# Patient Record
Sex: Female | Born: 1980 | Race: Black or African American | Hispanic: No | Marital: Single | Smoking: Former smoker
Health system: Southern US, Community
[De-identification: ages and names within clinical notes are randomized; demographics above are authoritative.]

## PROBLEM LIST (undated history)

## (undated) DIAGNOSIS — I890 Lymphedema, not elsewhere classified: Secondary | ICD-10-CM

## (undated) DIAGNOSIS — M419 Scoliosis, unspecified: Secondary | ICD-10-CM

## (undated) HISTORY — PX: OOPHORECTOMY: SHX86

---

## 2013-10-09 ENCOUNTER — Emergency Department: Payer: Self-pay | Admitting: Emergency Medicine

## 2013-12-25 ENCOUNTER — Emergency Department: Payer: Self-pay | Admitting: Emergency Medicine

## 2013-12-25 LAB — URINALYSIS, COMPLETE
Bacteria: NONE SEEN
Bilirubin,UR: NEGATIVE
GLUCOSE, UR: NEGATIVE mg/dL (ref 0–75)
Ketone: NEGATIVE
NITRITE: NEGATIVE
PH: 6 (ref 4.5–8.0)
Protein: NEGATIVE
RBC,UR: 4 /HPF (ref 0–5)
Specific Gravity: 1.015 (ref 1.003–1.030)

## 2015-04-04 ENCOUNTER — Encounter: Payer: Self-pay | Admitting: Emergency Medicine

## 2015-04-04 ENCOUNTER — Emergency Department
Admission: EM | Admit: 2015-04-04 | Discharge: 2015-04-04 | Disposition: A | Discharge status: Home / Self Care | Payer: Medicaid Other | Attending: Emergency Medicine | Admitting: Emergency Medicine

## 2015-04-04 DIAGNOSIS — Z720 Tobacco use: Secondary | ICD-10-CM | POA: Insufficient documentation

## 2015-04-04 DIAGNOSIS — K029 Dental caries, unspecified: Secondary | ICD-10-CM | POA: Insufficient documentation

## 2015-04-04 HISTORY — DX: Lymphedema, not elsewhere classified: I89.0

## 2015-04-04 HISTORY — DX: Scoliosis, unspecified: M41.9

## 2015-04-04 MED ORDER — AMOXICILLIN 500 MG PO CAPS: 500.0000 mg | ORAL_CAPSULE | Freq: Three times a day (TID) | ORAL | Status: DC

## 2015-04-04 MED ORDER — LIDOCAINE VISCOUS 2 % MT SOLN
15.0000 mL | Freq: Once | OROMUCOSAL | Status: AC
  Administered 2015-04-04: 15 mL via OROMUCOSAL
  Filled 2015-04-04: qty 15

## 2015-04-04 MED ORDER — OXYCODONE-ACETAMINOPHEN 5-325 MG PO TABS
1.0000 | ORAL_TABLET | Freq: Once | ORAL | Status: AC
  Administered 2015-04-04: 1 via ORAL
  Filled 2015-04-04: qty 1

## 2015-04-04 MED ORDER — KETOROLAC TROMETHAMINE 10 MG PO TABS
10.0000 mg | ORAL_TABLET | Freq: Once | ORAL | Status: AC
  Administered 2015-04-04: 10 mg via ORAL
  Filled 2015-04-04: qty 1

## 2015-04-04 MED ORDER — KETOROLAC TROMETHAMINE 10 MG PO TABS: 10.0000 mg | ORAL_TABLET | Freq: Three times a day (TID) | ORAL | Status: DC | PRN

## 2015-04-04 MED ORDER — AMOXICILLIN 500 MG PO CAPS
500.0000 mg | ORAL_CAPSULE | Freq: Once | ORAL | Status: AC
  Administered 2015-04-04: 500 mg via ORAL
  Filled 2015-04-04: qty 1

## 2015-04-04 NOTE — Discharge Instructions (Signed)
Dental Caries °Dental caries (also called tooth decay) is the most common oral disease. It can occur at any age but is more common in children and young adults.  °HOW DENTAL CARIES DEVELOPS  °The process of decay begins when bacteria and foods (particularly sugars and starches) combine in your mouth to produce plaque. Plaque is a substance that sticks to the hard, outer surface of a tooth (enamel). The bacteria in plaque produce acids that attack enamel. These acids may also attack the root surface of a tooth (cementum) if it is exposed. Repeated attacks dissolve these surfaces and create holes in the tooth (cavities). If left untreated, the acids destroy the other layers of the tooth.  °RISK FACTORS °· Frequent sipping of sugary beverages.   °· Frequent snacking on sugary and starchy foods, especially those that easily get stuck in the teeth.   °· Poor oral hygiene.   °· Dry mouth.   °· Substance abuse such as methamphetamine abuse.   °· Broken or poor-fitting dental restorations.   °· Eating disorders.   °· Gastroesophageal reflux disease (GERD).   °· Certain radiation treatments to the head and neck. °SYMPTOMS °In the early stages of dental caries, symptoms are seldom present. Sometimes white, chalky areas may be seen on the enamel or other tooth layers. In later stages, symptoms may include: °· Pits and holes on the enamel. °· Toothache after sweet, hot, or cold foods or drinks are consumed. °· Pain around the tooth. °· Swelling around the tooth. °DIAGNOSIS  °Most of the time, dental caries is detected during a regular dental checkup. A diagnosis is made after a thorough medical and dental history is taken and the surfaces of your teeth are checked for signs of dental caries. Sometimes special instruments, such as lasers, are used to check for dental caries. Dental X-ray exams may be taken so that areas not visible to the eye (such as between the contact areas of the teeth) can be checked for cavities.    °TREATMENT  °If dental caries is in its early stages, it may be reversed with a fluoride treatment or an application of a remineralizing agent at the dental office. Thorough brushing and flossing at home is needed to aid these treatments. If it is in its later stages, treatment depends on the location and extent of tooth destruction:  °· If a small area of the tooth has been destroyed, the destroyed area will be removed and cavities will be filled with a material such as gold, silver amalgam, or composite resin.   °· If a large area of the tooth has been destroyed, the destroyed area will be removed and a cap (crown) will be fitted over the remaining tooth structure.   °· If the center part of the tooth (pulp) is affected, a procedure called a root canal will be needed before a filling or crown can be placed.   °· If most of the tooth has been destroyed, the tooth may need to be pulled (extracted). °HOME CARE INSTRUCTIONS °You can prevent, stop, or reverse dental caries at home by practicing good oral hygiene. Good oral hygiene includes: °· Thoroughly cleaning your teeth at least twice a day with a toothbrush and dental floss.   °· Using a fluoride toothpaste. A fluoride mouth rinse may also be used if recommended by your dentist or health care provider.   °· Restricting the amount of sugary and starchy foods and sugary liquids you consume.   °· Avoiding frequent snacking on these foods and sipping of these liquids.   °· Keeping regular visits with   a dentist for checkups and cleanings. PREVENTION   Practice good oral hygiene.  Consider a dental sealant. A dental sealant is a coating material that is applied by your dentist to the pits and grooves of teeth. The sealant prevents food from being trapped in them. It may protect the teeth for several years.  Ask about fluoride supplements if you live in a community without fluorinated water or with water that has a low fluoride content. Use fluoride supplements  as directed by your dentist or health care provider.  Allow fluoride varnish applications to teeth if directed by your dentist or health care provider. Document Released: 05/27/2002 Document Revised: 01/19/2014 Document Reviewed: 09/06/2012 [facility] Patient Information 2015 [employer], Maryland[employer]. This information is not intended to replace advice given to you by your health care provider. Make sure you discuss any questions you have with your health care provider.  Dental Caries Dental caries (also called tooth decay) is the most common oral disease. It can occur at any age but is more common in children and young adults.  HOW DENTAL CARIES DEVELOPS  The process of decay begins when bacteria and foods (particularly sugars and starches) combine in your mouth to produce plaque. Plaque is a substance that sticks to the hard, outer surface of a tooth (enamel). The bacteria in plaque produce acids that attack enamel. These acids may also attack the root surface of a tooth (cementum) if it is exposed. Repeated attacks dissolve these surfaces and create holes in the tooth (cavities). If left untreated, the acids destroy the other layers of the tooth.  RISK FACTORS  Frequent sipping of sugary beverages.   Frequent snacking on sugary and starchy foods, especially those that easily get stuck in the teeth.   Poor oral hygiene.   Dry mouth.   Substance abuse such as methamphetamine abuse.   Broken or poor-fitting dental restorations.   Eating disorders.   Gastroesophageal reflux disease (GERD).   Certain radiation treatments to the head and neck. SYMPTOMS In the early stages of dental caries, symptoms are seldom present. Sometimes white, chalky areas may be seen on the enamel or other tooth layers. In later stages, symptoms may include:  Pits and holes on the enamel.  Toothache after sweet, hot, or cold foods or drinks are consumed.  Pain around the tooth.  Swelling around the  tooth. DIAGNOSIS  Most of the time, dental caries is detected during a regular dental checkup. A diagnosis is made after a thorough medical and dental history is taken and the surfaces of your teeth are checked for signs of dental caries. Sometimes special instruments, such as lasers, are used to check for dental caries. Dental X-ray exams may be taken so that areas not visible to the eye (such as between the contact areas of the teeth) can be checked for cavities.  TREATMENT  If dental caries is in its early stages, it may be reversed with a fluoride treatment or an application of a remineralizing agent at the dental office. Thorough brushing and flossing at home is needed to aid these treatments. If it is in its later stages, treatment depends on the location and extent of tooth destruction:   If a small area of the tooth has been destroyed, the destroyed area will be removed and cavities will be filled with a material such as gold, silver amalgam, or composite resin.   If a large area of the tooth has been destroyed, the destroyed area will be removed and a cap (  crown) will be fitted over the remaining tooth structure.   °· If the center part of the tooth (pulp) is affected, a procedure called a root canal will be needed before a filling or crown can be placed.   °· If most of the tooth has been destroyed, the tooth may need to be pulled (extracted). °HOME CARE INSTRUCTIONS °You can prevent, stop, or reverse dental caries at home by practicing good oral hygiene. Good oral hygiene includes: °· Thoroughly cleaning your teeth at least twice a day with a toothbrush and dental floss.   °· Using a fluoride toothpaste. A fluoride mouth rinse may also be used if recommended by your dentist or health care provider.   °· Restricting the amount of sugary and starchy foods and sugary liquids you consume.   °· Avoiding frequent snacking on these foods and sipping of these liquids.   °· Keeping regular visits with a  dentist for checkups and cleanings. °PREVENTION  °· Practice good oral hygiene. °· Consider a dental sealant. A dental sealant is a coating material that is applied by your dentist to the pits and grooves of teeth. The sealant prevents food from being trapped in them. It may protect the teeth for several years. °· Ask about fluoride supplements if you live in a community without fluorinated water or with water that has a low fluoride content. Use fluoride supplements as directed by your dentist or health care provider. °· Allow fluoride varnish applications to teeth if directed by your dentist or health care provider. °Document Released: 05/27/2002 Document Revised: 01/19/2014 Document Reviewed: 09/06/2012 °ExitCare® Patient Information ©2015 [employer], LLC. This information is not intended to replace advice given to you by your health care provider. Make sure you discuss any questions you have with your health care provider. ° °

## 2015-04-04 NOTE — ED Provider Notes (Signed)
[facility] Emergency Department Provider Note  ____________________________________________  Time seen: 11:40 AM  I have reviewed the triage vital signs and the nursing notes.   HISTORY  Chief Complaint Dental Pain      HPI Stacy Downs is a 34 y.o. female presents with toothache 2 weeks but worse in the last 2 days.Patient denies any fever no difficulty swallowing.    Past Medical History  Diagnosis Date  . Lymphedema   . Scoliosis     There are no active problems to display for this patient.   Past Surgical History  Procedure Laterality Date  . Oophorectomy Left     No current outpatient prescriptions on file.  Allergies Shrimp  History reviewed. No pertinent family history.  Social History History  Substance Use Topics  . Smoking status: Current Every Day Smoker -- 0.50 packs/day  . Smokeless tobacco: Never Used  . Alcohol Use: Yes     Comment: occasional    Review of Systems  Constitutional: Negative for fever. Eyes: Negative for visual changes. ENT: Negative for sore throat. Positive for toothache Cardiovascular: Negative for chest pain. Respiratory: Negative for shortness of breath. Gastrointestinal: Negative for abdominal pain, vomiting and diarrhea. Genitourinary: Negative for dysuria. Musculoskeletal: Negative for back pain. Skin: Negative for rash. Neurological: Negative for headaches, focal weakness or numbness.   10-point ROS otherwise negative.  ____________________________________________   PHYSICAL EXAM:  VITAL SIGNS: ED Triage Vitals  Enc Vitals Group     BP 04/04/15 1111 114/71 mmHg     Pulse Rate 04/04/15 1111 83     Resp 04/04/15 1111 16     Temp 04/04/15 1111 97.7 F (36.5 C)     Temp Source 04/04/15 1111 Oral     SpO2 04/04/15 1111 100 %     Weight --      Height --      Head Cir --      Peak Flow --      Pain Score 04/04/15 1112 10     Pain Loc --      Pain Edu? --      Excl. in  GC? --      Constitutional: Alert and oriented. Well appearing and in no distress. Eyes: Conjunctivae are normal. PERRL. Normal extraocular movements. ENT   Head: Normocephalic and atraumatic.   Nose: No congestion/rhinnorhea.   Mouth/Throat: Mucous membranes are moist. Multiple dental caries involving maxilla central incisor and right maxillary premolar.   Neck: No stridor. Hematological/Lymphatic/Immunilogical: No cervical lymphadenopathy. Cardiovascular: Normal rate, regular rhythm. Normal and symmetric distal pulses are present in all extremities. No murmurs, rubs, or gallops. Respiratory: Normal respiratory effort without tachypnea nor retractions. Breath sounds are clear and equal bilaterally. No wheezes/rales/rhonchi. Gastrointestinal: Soft and nontender. No distention. There is no CVA tenderness. Genitourinary: deferred Musculoskeletal: Nontender with normal range of motion in all extremities. No joint effusions.  No lower extremity tenderness nor edema. Neurologic:  Normal speech and language. No gross focal neurologic deficits are appreciated. Speech is normal.  Skin:  Skin is warm, dry and intact. No rash noted. Psychiatric: Mood and affect are normal. Speech and behavior are normal. Patient exhibits appropriate insight and judgment.  ____________________________________________     INITIAL IMPRESSION / ASSESSMENT AND PLAN / ED COURSE  Pertinent labs & imaging results that were available during my care of the patient were reviewed by me and considered in my medical decision making (see chart for details).  Strip physical exam consistent with multiple dental  caries as such patient received viscous lidocaine swish and spit one Percocet and amoxicillin.  ____________________________________________   FINAL CLINICAL IMPRESSION(S) / ED DIAGNOSES  Final diagnoses:  Dental caries      Darci Currentandolph N Mauria Asquith, MD 04/06/15 [phone]

## 2015-04-04 NOTE — ED Notes (Signed)
Pt complains of a toothache on left side of mouth for the last two days, pt denies any problems

## 2015-04-04 NOTE — ED Notes (Signed)
Pt states she has had toothache for 2 weeks. It went away but came back last night. She took tylenol and anbesol, with no relief. Pt states it is giving her a splitting headache and that she is unable to eat or sleep due to the pain.

## 2015-05-18 ENCOUNTER — Encounter: Payer: Self-pay | Admitting: Emergency Medicine

## 2015-05-18 ENCOUNTER — Emergency Department
Admission: EM | Admit: 2015-05-18 | Discharge: 2015-05-18 | Disposition: A | Discharge status: Home / Self Care | Payer: No Typology Code available for payment source | Attending: Student | Admitting: Student

## 2015-05-18 DIAGNOSIS — Z720 Tobacco use: Secondary | ICD-10-CM | POA: Diagnosis not present

## 2015-05-18 DIAGNOSIS — K088 Other specified disorders of teeth and supporting structures: Secondary | ICD-10-CM | POA: Diagnosis present

## 2015-05-18 DIAGNOSIS — K0889 Other specified disorders of teeth and supporting structures: Secondary | ICD-10-CM

## 2015-05-18 MED ORDER — LIDOCAINE VISCOUS 2 % MT SOLN: 20.0000 mL | OROMUCOSAL | Status: DC | PRN

## 2015-05-18 MED ORDER — HYDROCODONE-ACETAMINOPHEN 5-325 MG PO TABS: 1.0000 | ORAL_TABLET | ORAL | Status: DC | PRN

## 2015-05-18 MED ORDER — NAPROXEN 500 MG PO TBEC: 500.0000 mg | DELAYED_RELEASE_TABLET | Freq: Two times a day (BID) | ORAL | Status: DC

## 2015-05-18 MED ORDER — AMOXICILLIN 500 MG PO TABS: 500.0000 mg | ORAL_TABLET | Freq: Two times a day (BID) | ORAL | Status: DC

## 2015-05-18 MED ORDER — OXYCODONE-ACETAMINOPHEN 5-325 MG PO TABS
2.0000 | ORAL_TABLET | Freq: Once | ORAL | Status: AC
  Administered 2015-05-18: 2 via ORAL

## 2015-05-18 MED ORDER — OXYCODONE-ACETAMINOPHEN 5-325 MG PO TABS
ORAL_TABLET | ORAL | Status: AC
  Administered 2015-05-18: 2 via ORAL
  Filled 2015-05-18: qty 2

## 2015-05-18 NOTE — Discharge Instructions (Signed)
OPTIONS FOR DENTAL FOLLOW UP CARE ° °[place] [facility] of Health and Human Services - Local Safety Net Dental Clinics °http://www.ncdhhs.gov/dph/oralhealth/services/safetynetclinics.htm °  °[facility] (336-562-3123) ° °[facility] [place] (919-933-9087) ° °[facility] (919-663-1744 ext [phone]) ° °[facility] ([phone]) ° °[facility] ([phone]) °This clinic caters to the indigent population and is on a lottery system. °Location: °[facility], [address], [place] °Clinic Hours: °Wednesdays from 6pm - 9pm, patients seen by a lottery system. °For dates, call or go to [email or web address]/[facility]/[email or web address] °Services: °Cleanings, fillings and simple extractions. °Payment Options: °DENTAL WORK IS FREE OF CHARGE. Bring proof of income or support. °Best way to get seen: °Arrive at 5:15 pm - this is a lottery, NOT first come/first serve, so arriving earlier will not increase your chances of being seen. °  °  °[facility] °[phone] °Select option 1 for emergencies °  °Location: °[facility], [address], [place] °Clinic Hours: °No walk-ins accepted - call the day before to schedule an appointment. °Check in times are 9:30 am and 1:30 pm. °Services: °Simple extractions, temporary fillings, pulpectomy/pulp debridement, uncomplicated abscess drainage. °Payment Options: °PAYMENT IS DUE AT THE TIME OF SERVICE.  Fee is usually $100-200, additional surgical procedures (e.g. abscess drainage) may be extra. °Cash, checks, Visa/MasterCard accepted.  Can file Medicaid if patient is covered for dental - patient should call case worker to check. °No discount for [facility] patients. °Best way to get seen: °MUST call the day before and get onto the schedule. Can usually be seen the next 1-2 days. No walk-ins accepted. °  °  °[facility] °[phone] °   °Location: °[facility], [address], [place] °Clinic Hours: °M, W, Th, F 8am or 1:30pm, Tues 9a or 1:30 - first come/first served. °Services: °Simple extractions, temporary fillings, uncomplicated abscess drainage.  You do not need to be an [place] resident. °Payment Options: °PAYMENT IS DUE AT THE TIME OF SERVICE. °Dental insurance, otherwise sliding scale - bring proof of income or support. °Depending on income and treatment needed, cost is usually $50-200. °Best way to get seen: °Arrive early as it is first come/first served. °  °  °[facility] °[phone] °  °Location: °[address] °Clinic Hours: °Mon-Thu 8a-5p °Services: °Most basic dental services including extractions and fillings. °Payment Options: °PAYMENT IS DUE AT THE TIME OF SERVICE. °Sliding scale, up to 50% off - bring proof if income or support. °Medicaid with dental option accepted. °Best way to get seen: °Call to schedule an appointment, can usually be seen within 2 weeks OR they will try to see walk-ins - show up at 8a or 2p (you may have to wait). °  °  °[facility] °[phone] °[place] RESIDENTS ONLY °  °Location: °[facility], [address], [place],  [address] °Clinic Hours: By appointment only. °Monday - Thursday 8am-5pm, Friday 8am-12pm °Services: Cleanings, fillings, extractions. °Payment Options: °PAYMENT IS DUE AT THE TIME OF SERVICE. °Cash, Visa or MasterCard. Sliding scale - $30 minimum per service. °Best way to get seen: °Come in to office, complete packet and make an appointment - need proof of income °or support monies for each household member and proof of [place] residence. °Usually takes about a month to get in. °  °  °[facility] °[phone] °  °Location: °[address]   [place] °Clinic Hours: Walk-in Urgent Care Dental Services are offered Monday-Friday  mornings only. °The numbers of emergencies accepted daily is limited to the number of °providers available. °Maximum 15 - Mondays, Wednesdays & Thursdays °Maximum 10 - Tuesdays & Fridays °Services: °You do not need to be a [place] resident to be seen for a dental emergency. °Emergencies are defined as pain, swelling, abnormal bleeding, or dental trauma. Walkins will receive x-rays if needed. °NOTE: Dental cleaning is not an emergency. °Payment Options: °PAYMENT IS DUE AT THE TIME OF SERVICE. °Minimum co-pay is $40.00 for uninsured patients. °Minimum co-pay is $3.00 for Medicaid with dental coverage. °Dental Insurance is accepted and must be presented at time of visit. °Medicare does not cover dental. °Forms of payment: Cash, credit card, checks. °Best way to get seen: °If not previously registered with the clinic, walk-in dental registration begins at 7:15 am and is on a first come/first serve basis. °If previously registered with the clinic, call to make an appointment. °  °  °[facility] °[phone] °[place] RESIDENTS ONLY °  °Location: °[address], [place], [place] °Clinic Hours: °Mon-Thu 10a-2p °Services: Extractions only! °Payment Options: °FREE (donations accepted) - bring proof of income or support °Best way to get seen: °Call and schedule an appointment OR come at 8am on the 1st Monday of every month (except for holidays) when it is first come/first served. °  °  °[facility] °[phone] °  °Location: °[address], [place] °Clinic Hours: °Friday mornings °Services, Payment Options, Best way to get seen: °Call for info °Dental Pain °A tooth ache may be caused by cavities (tooth decay). Cavities expose the nerve of the tooth to air and hot or cold temperatures. It may come from an infection or abscess (also called a boil or furuncle) around your tooth. It is also often caused by dental caries (tooth decay). This causes the pain you are having. °DIAGNOSIS  °Your caregiver can  diagnose this problem by exam. °TREATMENT  °· If caused by an infection, it may be treated with medications which kill germs (antibiotics) and pain medications as prescribed by your caregiver. Take medications as directed. °· Only take over-the-counter or prescription medicines for pain, discomfort, or fever as directed by your caregiver. °· Whether the tooth ache today is caused by infection or dental disease, you should see your dentist as soon as possible for further care. °SEEK MEDICAL CARE IF: °The exam and treatment you received today has been provided on an emergency basis only. This is not a substitute for complete medical or dental care. If your problem worsens or new problems (symptoms) appear, and you are unable to meet with your dentist, call or return to this location. °SEEK IMMEDIATE MEDICAL CARE IF:  °· You have a fever. °· You develop redness and swelling of your face, jaw, or neck. °· You are unable to open your mouth. °· You have severe pain uncontrolled by pain medicine. °MAKE SURE YOU:  °· Understand these instructions. °· Will watch your condition. °· Will get help right away if you are not doing well or get worse. °Document Released: 09/04/2005 Document Revised: 11/27/2011 Document Reviewed: 04/22/2008 °ExitCare® Patient Information ©2015 [employer], [employer]. This information is not intended to replace advice given to you by your health care provider. Make sure you discuss any questions you have with your health care provider. ° °

## 2015-05-18 NOTE — ED Notes (Signed)
Patient to ED with report of toothache to right upper jaw that started today, reports it is causing a headache.

## 2015-05-18 NOTE — ED Provider Notes (Signed)
[facility] Emergency Department Provider Note  ____________________________________________  Time seen: Approximately 5:34 PM  I have reviewed the triage vital signs and the nursing notes.   HISTORY  Chief Complaint Dental Pain    HPI Stacy Downs is a 34 y.o. female presents with complaints of right upper tooth pain and jaw pain times today. Patient states severe pain causing headache.   Past Medical History  Diagnosis Date  . Lymphedema   . Scoliosis     There are no active problems to display for this patient.   Past Surgical History  Procedure Laterality Date  . Oophorectomy Left     Current Outpatient Rx  Name  Route  Sig  Dispense  Refill  . amoxicillin (AMOXIL) 500 MG tablet   Oral   Take 1 tablet (500 mg total) by mouth 2 (two) times daily.   20 tablet   0   . HYDROcodone-acetaminophen (NORCO) 5-325 MG per tablet   Oral   Take 1-2 tablets by mouth every 4 (four) hours as needed for moderate pain.   15 tablet   0   . lidocaine (XYLOCAINE) 2 % solution   Mouth/Throat   Use as directed 20 mLs in the mouth or throat as needed for mouth pain.   100 mL   0   . naproxen (EC NAPROSYN) 500 MG EC tablet   Oral   Take 1 tablet (500 mg total) by mouth 2 (two) times daily with a meal.   60 tablet   0     Allergies Shrimp  History reviewed. No pertinent family history.  Social History Social History  Substance Use Topics  . Smoking status: Current Every Day Smoker -- 0.50 packs/day  . Smokeless tobacco: Never Used  . Alcohol Use: Yes     Comment: occasional    Review of Systems Constitutional: No fever/chills Eyes: No visual changes. ENT: No sore throat. Positive for dental pain. Cardiovascular: Denies chest pain. Respiratory: Denies shortness of breath. Gastrointestinal: No abdominal pain.  No nausea, no vomiting.  No diarrhea.  No constipation. Genitourinary: Negative for dysuria. Musculoskeletal: Negative for  back pain. Skin: Negative for rash. Neurological: Negative for headaches, focal weakness or numbness.  10-point ROS otherwise negative.  ____________________________________________   PHYSICAL EXAM:  VITAL SIGNS: ED Triage Vitals  Enc Vitals Group     BP 05/18/15 1600 114/69 mmHg     Pulse Rate 05/18/15 1600 68     Resp 05/18/15 1600 20     Temp 05/18/15 1600 98.5 F (36.9 C)     Temp Source 05/18/15 1600 Oral     SpO2 05/18/15 1600 98 %     Weight 05/18/15 1600 160 lb (72.576 kg)     Height 05/18/15 1600 6' (1.829 m)     Head Cir --      Peak Flow --      Pain Score 05/18/15 1601 7     Pain Loc --      Pain Edu? --      Excl. in GC? --     Constitutional: Alert and oriented. Well appearing and in no acute distress. Eyes: Conjunctivae are normal. PERRL. EOMI. Head: Atraumatic. Nose: No congestion/rhinnorhea. Mouth/Throat: Mucous membranes are moist.  Oropharynx non-erythematous. Multiple dental work noted. Cavity noted on right upper molar gums tender no erythematous. Neck: No stridor.   Neurologic:  Normal speech and language. No gross focal neurologic deficits are appreciated. No gait instability. Skin:  Skin is warm,  dry and intact. No rash noted. Psychiatric: Mood and affect are normal. Speech and behavior are normal.  ____________________________________________   LABS (all labs ordered are listed, but only abnormal results are displayed)  Labs Reviewed - No data to display ____________________________________________   PROCEDURES Procedure(s) performed: None  Critical Care performed: No acute dental pain with early abscess. Rx given for amoxicillin 500 hydrocodone 5/325 and viscous lidocaine. Patient to follow up with PCP or return to the ER sooner as possible.  ____________________________________________   INITIAL IMPRESSION / ASSESSMENT AND PLAN / ED COURSE  Pertinent labs & imaging results that were available during my care of the patient were  reviewed by me and considered in my medical decision making (see chart for details).    ____________________________________________   FINAL CLINICAL IMPRESSION(S) / ED DIAGNOSES  Final diagnoses:  Toothache      Evangeline Dakin, PA-C 05/18/15 1750  Gayla Doss, MD 05/19/15 [phone]

## 2016-03-18 ENCOUNTER — Emergency Department
Admission: EM | Admit: 2016-03-18 | Discharge: 2016-03-18 | Disposition: A | Discharge status: Home / Self Care | Payer: No Typology Code available for payment source | Attending: Emergency Medicine | Admitting: Emergency Medicine

## 2016-03-18 ENCOUNTER — Encounter: Payer: Self-pay | Admitting: *Deleted

## 2016-03-18 DIAGNOSIS — Z791 Long term (current) use of non-steroidal anti-inflammatories (NSAID): Secondary | ICD-10-CM | POA: Insufficient documentation

## 2016-03-18 DIAGNOSIS — M419 Scoliosis, unspecified: Secondary | ICD-10-CM | POA: Insufficient documentation

## 2016-03-18 DIAGNOSIS — F17200 Nicotine dependence, unspecified, uncomplicated: Secondary | ICD-10-CM | POA: Insufficient documentation

## 2016-03-18 DIAGNOSIS — Z792 Long term (current) use of antibiotics: Secondary | ICD-10-CM | POA: Insufficient documentation

## 2016-03-18 DIAGNOSIS — Z91013 Allergy to seafood: Secondary | ICD-10-CM | POA: Insufficient documentation

## 2016-03-18 DIAGNOSIS — K029 Dental caries, unspecified: Secondary | ICD-10-CM | POA: Insufficient documentation

## 2016-03-18 DIAGNOSIS — Z79899 Other long term (current) drug therapy: Secondary | ICD-10-CM | POA: Insufficient documentation

## 2016-03-18 MED ORDER — OXYCODONE-ACETAMINOPHEN 5-325 MG PO TABS: 1.0000 | ORAL_TABLET | ORAL | Status: DC | PRN

## 2016-03-18 MED ORDER — AMOXICILLIN 500 MG PO CAPS: 500.0000 mg | ORAL_CAPSULE | Freq: Two times a day (BID) | ORAL | Status: AC

## 2016-03-18 MED ORDER — AMOXICILLIN 500 MG PO CAPS
500.0000 mg | ORAL_CAPSULE | Freq: Three times a day (TID) | ORAL | Status: DC
  Administered 2016-03-18: 500 mg via ORAL
  Filled 2016-03-18: qty 1

## 2016-03-18 MED ORDER — LIDOCAINE VISCOUS 2 % MT SOLN
15.0000 mL | Freq: Once | OROMUCOSAL | Status: AC
  Administered 2016-03-18: 15 mL via OROMUCOSAL
  Filled 2016-03-18: qty 15

## 2016-03-18 MED ORDER — OXYCODONE-ACETAMINOPHEN 5-325 MG PO TABS
1.0000 | ORAL_TABLET | Freq: Once | ORAL | Status: AC
  Administered 2016-03-18: 1 via ORAL
  Filled 2016-03-18: qty 1

## 2016-03-18 NOTE — ED Notes (Addendum)
Pt c/o dental pain intermittently x 2 months. Pt states increasing pain Friday afternoon that was not relieved w/ tylenol. Pt needs referral for dental clinics.

## 2016-03-18 NOTE — Discharge Instructions (Signed)

## 2016-03-18 NOTE — ED Provider Notes (Signed)
[facility] Emergency Department Provider Note  ____________________________________________  Time seen: 3:15 AM  I have reviewed the triage vital signs and the nursing notes.   HISTORY  Chief Complaint Dental Pain    HPI Stacy Downs is a 35 y.o. female presents with 2 month history of intermittent left upper maxillary dental pain at site of cavity. Patient denies any fever no difficulty swallowing no throat pain.     Past Medical History  Diagnosis Date  . Lymphedema   . Scoliosis     There are no active problems to display for this patient.   Past Surgical History  Procedure Laterality Date  . Oophorectomy Left     Current Outpatient Rx  Name  Route  Sig  Dispense  Refill  . amoxicillin (AMOXIL) 500 MG tablet   Oral   Take 1 tablet (500 mg total) by mouth 2 (two) times daily.   20 tablet   0   . HYDROcodone-acetaminophen (NORCO) 5-325 MG per tablet   Oral   Take 1-2 tablets by mouth every 4 (four) hours as needed for moderate pain.   15 tablet   0   . lidocaine (XYLOCAINE) 2 % solution   Mouth/Throat   Use as directed 20 mLs in the mouth or throat as needed for mouth pain.   100 mL   0   . naproxen (EC NAPROSYN) 500 MG EC tablet   Oral   Take 1 tablet (500 mg total) by mouth 2 (two) times daily with a meal.   60 tablet   0     Allergies Shrimp  History reviewed. No pertinent family history.  Social History Social History  Substance Use Topics  . Smoking status: Current Every Day Smoker -- 0.50 packs/day  . Smokeless tobacco: Never Used  . Alcohol Use: Yes     Comment: occasional    Review of Systems  Constitutional: Negative for fever. Eyes: Negative for visual changes. ENT: Negative for sore throat.Positive for dental pain Cardiovascular: Negative for chest pain. Respiratory: Negative for shortness of breath. Gastrointestinal: Negative for abdominal pain, vomiting and diarrhea. Genitourinary: Negative for  dysuria. Musculoskeletal: Negative for back pain. Skin: Negative for rash. Neurological: Negative for headaches, focal weakness or numbness.  10-point ROS otherwise negative.  ____________________________________________   PHYSICAL EXAM:  VITAL SIGNS: ED Triage Vitals  Enc Vitals Group     BP 03/18/16 0250 129/77 mmHg     Pulse Rate 03/18/16 0250 73     Resp 03/18/16 0250 16     Temp 03/18/16 0250 98.1 F (36.7 C)     Temp Source 03/18/16 0250 Oral     SpO2 03/18/16 0250 99 %     Weight 03/18/16 0250 157 lb (71.215 kg)     Height 03/18/16 0250 5\' 9"  (1.753 m)     Head Cir --      Peak Flow --      Pain Score 03/18/16 0254 10     Pain Loc --      Pain Edu? --      Excl. in GC? --     Constitutional: Alert and oriented. Well appearing and in no distress. Eyes: Conjunctivae are normal. PERRL. Normal extraocular movements. ENT   Head: Normocephalic and atraumatic.   Nose: No congestion/rhinnorhea.   Mouth/Throat:Left upper maxillary pre-molar dental caries noted   Neck: No stridor. Hematological/Lymphatic/Immunilogical: No cervical lymphadenopathy.     Procedures      INITIAL IMPRESSION / ASSESSMENT AND  PLAN / ED COURSE  Pertinent labs & imaging results that were available during my care of the patient were reviewed by me and considered in my medical decision making (see chart for details).  Patient received Percocet amoxicillin and  lidocaine  ____________________________________________   FINAL CLINICAL IMPRESSION(S) / ED DIAGNOSES  Final diagnoses:  Dental caries      Darci Currentandolph N Brown, MD 03/18/16 [id number]

## 2016-04-10 ENCOUNTER — Emergency Department
Admission: EM | Admit: 2016-04-10 | Discharge: 2016-04-10 | Disposition: A | Discharge status: Home / Self Care | Payer: No Typology Code available for payment source | Attending: Emergency Medicine | Admitting: Emergency Medicine

## 2016-04-10 ENCOUNTER — Encounter: Payer: Self-pay | Admitting: Emergency Medicine

## 2016-04-10 DIAGNOSIS — F17200 Nicotine dependence, unspecified, uncomplicated: Secondary | ICD-10-CM | POA: Insufficient documentation

## 2016-04-10 DIAGNOSIS — K0889 Other specified disorders of teeth and supporting structures: Secondary | ICD-10-CM

## 2016-04-10 DIAGNOSIS — K029 Dental caries, unspecified: Secondary | ICD-10-CM | POA: Insufficient documentation

## 2016-04-10 MED ORDER — OXYCODONE-ACETAMINOPHEN 5-325 MG PO TABS: 1.0000 | ORAL_TABLET | ORAL | 0 refills | Status: DC | PRN

## 2016-04-10 MED ORDER — AZITHROMYCIN 250 MG PO TABS: ORAL_TABLET | ORAL | 0 refills | Status: DC

## 2016-04-10 MED ORDER — LIDOCAINE VISCOUS 2 % MT SOLN: 20.0000 mL | OROMUCOSAL | 0 refills | Status: DC | PRN

## 2016-04-10 NOTE — ED Triage Notes (Signed)
Patient presents to the ED with left sided dental pain that began last night.  Patient states pain has been intermittent for months but worse now.

## 2016-04-10 NOTE — ED Notes (Addendum)
See triage note. Pt states pain increased since yesterday. Pt states pain 10/10. Pt states she can't afford a dentist. She took hydrocodone last night and tylenol this morning (x6) with no relief. States her tooth is now bleeding.

## 2016-04-10 NOTE — ED Provider Notes (Signed)
[facility] Emergency Department Provider Note  ____________________________________________  Time seen: Approximately 1:32 PM  I have reviewed the triage vital signs and the nursing notes.   HISTORY  Chief Complaint Dental Pain    HPI Stacy Downs is a 35 y.o. female presents for evaluation of left lower dental pain times last night. Patient states this started bleeding earlier. Desires a list for local dental providers. Describes her pain as a 10 over 10 at this time.   Past Medical History:  Diagnosis Date  . Lymphedema   . Scoliosis     There are no active problems to display for this patient.   Past Surgical History:  Procedure Laterality Date  . OOPHORECTOMY Left     Current Outpatient Rx  . Order #: [id number] Class: Print  . Order #: [id number] Class: Print  . Order #: [id number] Class: Print    Allergies Shrimp [shellfish allergy]  No family history on file.  Social History Social History  Substance Use Topics  . Smoking status: Current Every Day Smoker    Packs/day: 0.50  . Smokeless tobacco: Never Used  . Alcohol use Yes     Comment: occasional    Review of Systems Constitutional: No fever/chills Eyes: No visual changes. ENT: Positive for dental pain. Cardiovascular: Denies chest pain. Respiratory: Denies shortness of breath. Gastrointestinal: No abdominal pain.  No nausea, no vomiting.  No diarrhea.  No constipation. Genitourinary: Negative for dysuria. Musculoskeletal: Negative for back pain. Skin: Negative for rash. Neurological: Negative for headaches, focal weakness or numbness.  10-point ROS otherwise negative.  ____________________________________________   PHYSICAL EXAM:  VITAL SIGNS: ED Triage Vitals  Enc Vitals Group     BP 04/10/16 1223 118/65     Pulse Rate 04/10/16 1223 78     Resp 04/10/16 1223 18     Temp 04/10/16 1223 98.1 F (36.7 C)     Temp Source 04/10/16 1223 Oral     SpO2 04/10/16  1223 98 %     Weight 04/10/16 1223 160 lb (72.6 kg)     Height 04/10/16 1223  (1.727 m)     Head Circumference --      Peak Flow --      Pain Score 04/10/16 1224 9     Pain Loc --      Pain Edu? --      Excl. in GC? --     Constitutional: Alert and oriented. Well appearing and in no acute distress. Eyes: Conjunctivae are normal. PERRL. EOMI. Head: Atraumatic. Nose: No congestion/rhinnorhea. Mouth/Throat: Mucous membranes are moist.  Oropharynx non-erythematous. Obvious dental caries noted throughout. Neck: No stridor.  Supple full range of motion nontender. Cardiovascular: Normal rate, regular rhythm. Grossly normal heart sounds.  Good peripheral circulation. Respiratory: Normal respiratory effort.  No retractions. Lungs CTAB. Gastrointestinal: Soft and nontender. No distention. No abdominal bruits. No CVA tenderness. Musculoskeletal: No lower extremity tenderness nor edema.  No joint effusions. Neurologic:  Normal speech and language. No gross focal neurologic deficits are appreciated. No gait instability. Skin:  Skin is warm, dry and intact. No rash noted. Psychiatric: Mood and affect are normal. Speech and behavior are normal.  ____________________________________________   LABS (all labs ordered are listed, but only abnormal results are displayed)  Labs Reviewed - No data to display ____________________________________________  EKG   ____________________________________________  RADIOLOGY   ____________________________________________   PROCEDURES  Procedure(s) performed: None  Critical Care performed: No  ____________________________________________   INITIAL IMPRESSION / ASSESSMENT AND PLAN /  ED COURSE  Pertinent labs & imaging results that were available during my care of the patient were reviewed by me and considered in my medical decision making (see chart for details).  Acute and recurrent dental pain. Patient was given a prescription for  Zithromax, Percocet, and viscous lidocaine. A list of local dental providers given she is to follow-up within. She voices no other emergency medical complaints this time.  Clinical Course    ____________________________________________   FINAL CLINICAL IMPRESSION(S) / ED DIAGNOSES  Final diagnoses:  Pain, dental  Dental caries     This chart was dictated using voice recognition software/Dragon. Despite best efforts to proofread, errors can occur which can change the meaning. Any change was purely unintentional.    Evangeline Dakin, PA-C 04/10/16 1532    Jeanmarie Plant, MD 04/10/16 1534

## 2016-04-10 NOTE — ED Notes (Signed)
Pt discharged home after verbalizing understanding of discharge instructions; nad noted. 

## 2017-01-24 ENCOUNTER — Ambulatory Visit
Admit: 2017-01-24 | Discharge: 2017-01-24 | Disposition: A | Discharge status: Home / Self Care | Payer: BLUE CROSS/BLUE SHIELD | Attending: Family Medicine | Admitting: Family Medicine

## 2017-01-24 ENCOUNTER — Ambulatory Visit
Admission: EM | Admit: 2017-01-24 | Discharge: 2017-01-24 | Disposition: A | Discharge status: Home / Self Care | Payer: BLUE CROSS/BLUE SHIELD | Attending: Family Medicine | Admitting: Family Medicine

## 2017-01-24 ENCOUNTER — Emergency Department
Admission: EM | Admit: 2017-01-24 | Discharge: 2017-01-24 | Disposition: A | Discharge status: Home / Self Care | Payer: BLUE CROSS/BLUE SHIELD | Attending: Emergency Medicine | Admitting: Emergency Medicine

## 2017-01-24 DIAGNOSIS — O034 Incomplete spontaneous abortion without complication: Secondary | ICD-10-CM

## 2017-01-24 DIAGNOSIS — R938 Abnormal findings on diagnostic imaging of other specified body structures: Secondary | ICD-10-CM | POA: Insufficient documentation

## 2017-01-24 DIAGNOSIS — Z09 Encounter for follow-up examination after completed treatment for conditions other than malignant neoplasm: Secondary | ICD-10-CM

## 2017-01-24 DIAGNOSIS — N9989 Other postprocedural complications and disorders of genitourinary system: Secondary | ICD-10-CM | POA: Diagnosis not present

## 2017-01-24 DIAGNOSIS — N939 Abnormal uterine and vaginal bleeding, unspecified: Secondary | ICD-10-CM

## 2017-01-24 DIAGNOSIS — Z9889 Other specified postprocedural states: Secondary | ICD-10-CM

## 2017-01-24 DIAGNOSIS — F17200 Nicotine dependence, unspecified, uncomplicated: Secondary | ICD-10-CM | POA: Insufficient documentation

## 2017-01-24 LAB — CBC WITH DIFFERENTIAL/PLATELET
Basophils Absolute: 0.1 10*3/uL (ref 0–0.1)
Basophils Relative: 1 %
EOS PCT: 1 %
Eosinophils Absolute: 0.1 10*3/uL (ref 0–0.7)
HCT: 38.2 % (ref 35.0–47.0)
Hemoglobin: 12.8 g/dL (ref 12.0–16.0)
LYMPHS ABS: 1.4 10*3/uL (ref 1.0–3.6)
LYMPHS PCT: 14 %
MCH: 33.8 pg (ref 26.0–34.0)
MCHC: 33.4 g/dL (ref 32.0–36.0)
MCV: 101.1 fL — AB (ref 80.0–100.0)
MONO ABS: 0.6 10*3/uL (ref 0.2–0.9)
MONOS PCT: 6 %
Neutro Abs: 7.7 10*3/uL — ABNORMAL HIGH (ref 1.4–6.5)
Neutrophils Relative %: 78 %
PLATELETS: 203 10*3/uL (ref 150–440)
RBC: 3.78 MIL/uL — AB (ref 3.80–5.20)
RDW: 12.9 % (ref 11.5–14.5)
WBC: 9.9 10*3/uL (ref 3.6–11.0)

## 2017-01-24 LAB — URINALYSIS, COMPLETE (UACMP) WITH MICROSCOPIC
Bilirubin Urine: NEGATIVE
GLUCOSE, UA: NEGATIVE mg/dL
Ketones, ur: NEGATIVE mg/dL
Leukocytes, UA: NEGATIVE
Nitrite: NEGATIVE
PROTEIN: NEGATIVE mg/dL
SPECIFIC GRAVITY, URINE: 1.025 (ref 1.005–1.030)
pH: 5.5 (ref 5.0–8.0)

## 2017-01-24 LAB — HCG, QUANTITATIVE, PREGNANCY: HCG, BETA CHAIN, QUANT, S: 122 m[IU]/mL — AB (ref <5)

## 2017-01-24 LAB — ABO/RH: ABO/RH(D): A POS

## 2017-01-24 MED ORDER — MISOPROSTOL 200 MCG PO TABS: 800.0000 ug | ORAL_TABLET | Freq: Once | ORAL | 1 refills | Status: DC

## 2017-01-24 MED ORDER — HYDROCODONE-ACETAMINOPHEN 5-325 MG PO TABS: 1.0000 | ORAL_TABLET | Freq: Four times a day (QID) | ORAL | 0 refills | Status: DC | PRN

## 2017-01-24 NOTE — ED Provider Notes (Signed)
[facility] Emergency Department Provider Note   ____________________________________________   I have reviewed the triage vital signs and the nursing notes.   HISTORY  Chief Complaint Vaginal Bleeding   History limited by: Not Limited   HPI Stacy Downs is a 36 y.o. female who presents to the emergency department today from urgent care because of concerns for retained part of conception. The patient ate she had an elective abortion about 2-1/2 weeks previous. She says it was a surgical abortion. She states that she was roughly [redacted] weeks pregnant when it happened. Since then she has had some bleeding. Initially she had some lower abdominal cramping however this has resolved. She states she has continued to bled. At urgent care they took blood work and ultrasound which was concerning for retained products of conception. Advised patient to present to the emergency department. She denies any shortness of breath, chest pain or fevers. No nausea or vomiting.   Past Medical History:  Diagnosis Date  . Lymphedema   . Scoliosis     There are no active problems to display for this patient.   Past Surgical History:  Procedure Laterality Date  . OOPHORECTOMY Left     Prior to Admission medications   Medication Sig Start Date End Date Taking? Authorizing Provider  azithromycin (ZITHROMAX Z-PAK) 250 MG tablet Take 2 tablets (500 mg) on  Day 1,  followed by 1 tablet (250 mg) once daily on Days 2 through 5. 04/10/16   Beers, Charmayne Sheerharles M, PA-C  lidocaine (XYLOCAINE) 2 % solution Use as directed 20 mLs in the mouth or throat as needed for mouth pain. 04/10/16   Beers, Charmayne Sheerharles M, PA-C  oxyCODONE-acetaminophen (ROXICET) 5-325 MG tablet Take 1-2 tablets by mouth every 4 (four) hours as needed for severe pain. 04/10/16   Beers, Charmayne Sheerharles M, PA-C    Allergies Shrimp [shellfish allergy]  History reviewed. No pertinent family history.  Social History Social History  Substance  Use Topics  . Smoking status: Current Every Day Smoker    Packs/day: 0.50  . Smokeless tobacco: Never Used  . Alcohol use Yes     Comment: occasional    Review of Systems Constitutional: No fever/chills Eyes: No visual changes. ENT: No sore throat. Cardiovascular: Denies chest pain. Respiratory: Denies shortness of breath. Gastrointestinal: No abdominal pain.  No nausea, no vomiting.  No diarrhea.   Genitourinary: Negative for dysuria. Musculoskeletal: Negative for back pain. Skin: Negative for rash. Neurological: Negative for headaches, focal weakness or numbness.  ____________________________________________   PHYSICAL EXAM:  VITAL SIGNS: ED Triage Vitals  Enc Vitals Group     BP 01/24/17 1703 120/62     Pulse Rate 01/24/17 1703 66     Resp 01/24/17 1703 16     Temp 01/24/17 1703 98.8 F (37.1 C)     Temp Source 01/24/17 1703 Oral     SpO2 01/24/17 1703 100 %     Weight 01/24/17 1703 160 lb (72.6 kg)     Height 01/24/17 1703 5\' 8"  (1.727 m)     Head Circumference --      Peak Flow --      Pain Score 01/24/17 1709 0   Constitutional: Alert and oriented. Well appearing and in no distress. Eyes: Conjunctivae are normal. Normal extraocular movements. ENT   Head: Normocephalic and atraumatic.   Nose: No congestion/rhinnorhea.   Mouth/Throat: Mucous membranes are moist.   Neck: No stridor. Hematological/Lymphatic/Immunilogical: No cervical lymphadenopathy. Cardiovascular: Normal rate, regular rhythm.  No murmurs, rubs, or gallops.  Respiratory: Normal respiratory effort without tachypnea nor retractions. Breath sounds are clear and equal bilaterally. No wheezes/rales/rhonchi. Gastrointestinal: Soft and non tender. No rebound. No guarding.  Genitourinary: Deferred Musculoskeletal: Normal range of motion in all extremities. No lower extremity edema. Neurologic:  Normal speech and language. No gross focal neurologic deficits are appreciated.  Skin:  Skin is  warm, dry and intact. No rash noted. Psychiatric: Mood and affect are normal. Speech and behavior are normal. Patient exhibits appropriate insight and judgment.  ____________________________________________    LABS (pertinent positives/negatives)  Labs Reviewed  ABO/RH     ____________________________________________   EKG  None  ____________________________________________    RADIOLOGY  None  ____________________________________________   PROCEDURES  Procedures  ____________________________________________   INITIAL IMPRESSION / ASSESSMENT AND PLAN / ED COURSE  Pertinent labs & imaging results that were available during my care of the patient were reviewed by me and considered in my medical decision making (see chart for details).  Patient presented to the emergency department today because of concerns for retained proximal of conception. Patient was seen by Dr. Feliberto Gottron with Ob/gyn. Given low amount of bleeding feels she does not need emergent D and C. Will trial cytotec. Recommended 800mg  vaginally once, followed by a dose three days later if first dose ineffective.   ____________________________________________   FINAL CLINICAL IMPRESSION(S) / ED DIAGNOSES  Final diagnoses:  Vaginal bleeding  Retained products of conception following abortion     Note: This dictation was prepared with Dragon dictation. Any transcriptional errors that result from this process are unintentional     Phineas Semen, MD 01/24/17 [phone]

## 2017-01-24 NOTE — ED Provider Notes (Signed)
[facility]    CSN: [id number] Arrival date & time: 01/24/17  1106     History   Chief Complaint Chief Complaint  Patient presents with  . Vaginal Bleeding    HPI Stacy Downs is a 36 y.o. female.   36 yo female with a c/o vaginal bleeding for 2 and a half weeks, since having medical pregnancy termination. States she had the termination procedure done on 01/06/17 and started on birth control patch on 01/08/17. Has been having to use 1-2 pads per day for vaginal bleeding since the procedure. Denies any fevers, chills, pain. States occasionally feels some cramping.    The history is provided by the patient.  Vaginal Bleeding    Past Medical History:  Diagnosis Date  . Lymphedema   . Scoliosis     There are no active problems to display for this patient.   Past Surgical History:  Procedure Laterality Date  . OOPHORECTOMY Left     OB History    No data available       Home Medications    Prior to Admission medications   Medication Sig Start Date End Date Taking? Authorizing Provider  azithromycin (ZITHROMAX Z-PAK) 250 MG tablet Take 2 tablets (500 mg) on  Day 1,  followed by 1 tablet (250 mg) once daily on Days 2 through 5. 04/10/16   Beers, Charmayne Sheerharles M, PA-C  lidocaine (XYLOCAINE) 2 % solution Use as directed 20 mLs in the mouth or throat as needed for mouth pain. 04/10/16   Beers, Charmayne Sheerharles M, PA-C  oxyCODONE-acetaminophen (ROXICET) 5-325 MG tablet Take 1-2 tablets by mouth every 4 (four) hours as needed for severe pain. 04/10/16   Beers, Charmayne Sheerharles M, PA-C    Family History History reviewed. No pertinent family history.  Social History Social History  Substance Use Topics  . Smoking status: Current Every Day Smoker    Packs/day: 0.50  . Smokeless tobacco: Never Used  . Alcohol use Yes     Comment: occasional     Allergies   Shrimp [shellfish allergy]   Review of Systems Review of Systems  Genitourinary: Positive for vaginal bleeding.      Physical Exam Triage Vital Signs ED Triage Vitals  Enc Vitals Group     BP 01/24/17 1152 (!) 96/40     Pulse Rate 01/24/17 1152 66     Resp 01/24/17 1152 18     Temp 01/24/17 1152 98.6 F (37 C)     Temp Source 01/24/17 1152 Oral     SpO2 01/24/17 1152 100 %     Weight 01/24/17 1150 160 lb (72.6 kg)     Height 01/24/17 1150 5\' 6"  (1.676 m)     Head Circumference --      Peak Flow --      Pain Score 01/24/17 1150 0     Pain Loc --      Pain Edu? --      Excl. in GC? --    No data found.   Updated Vital Signs BP (!) 96/40 (BP Location: Left Arm)   Pulse 66   Temp 98.6 F (37 C) (Oral)   Resp 18   Ht 5\' 6"  (1.676 m)   Wt 160 lb (72.6 kg)   LMP 11/21/2016   SpO2 100%   BMI 25.82 kg/m   Visual Acuity Right Eye Distance:   Left Eye Distance:   Bilateral Distance:    Right Eye Near:   Left Eye Near:  Bilateral Near:     Physical Exam  Constitutional: She appears well-developed and well-nourished. No distress.  Abdominal: Soft. Bowel sounds are normal. She exhibits no distension and no mass. There is no tenderness. There is no rebound and no guarding.  Skin: She is not diaphoretic.  Nursing note and vitals reviewed.    UC Treatments / Results  Labs (all labs ordered are listed, but only abnormal results are displayed) Labs Reviewed  CBC WITH DIFFERENTIAL/PLATELET - Abnormal; Notable for the following:       Result Value   RBC 3.78 (*)    MCV 101.1 (*)    Neutro Abs 7.7 (*)    All other components within normal limits  URINALYSIS, COMPLETE (UACMP) WITH MICROSCOPIC - Abnormal; Notable for the following:    Hgb urine dipstick MODERATE (*)    Squamous Epithelial / LPF 6-30 (*)    Bacteria, UA RARE (*)    All other components within normal limits  HCG, QUANTITATIVE, PREGNANCY - Abnormal; Notable for the following:    hCG, Beta Chain, Quant, S 122 (*)    All other components within normal limits    EKG  EKG Interpretation None        Radiology US Ob Comp Less 14 Wks  Result Date: 01/24/2017 CLINICAL DATA:  Vaginal bleeding. Status post EAB 01/06/2017. Residual beta HCG of 142. EXAM: OBSTETRIC <14 WK [place] AND TRANSVAGINAL OB US TECHNIQUE: Both transabdominal and transvaginal ultrasound examinations were performed for complete evaluation of the gestation as well as the maternal uterus, adnexal regions, and pelvic cul-de-sac. Transvaginal technique was performed to assess early pregnancy. COMPARISON:  None. FINDINGS: Intrauterine gestational sac: Not present Yolk sac:  Not present Embryo:  Not present Cardiac Activity: Not present Maternal uterus/adnexae: Uterus is of normal size measuring 9.2 x 5.0 x 5.4 cm. Hypoechoic endometrial thickening measures up to 2.0 cm. There is no increased vascularity on color Doppler imaging. The right ovary is within normal limits. The left ovary is surgically absent. There is no significant free fluid. IMPRESSION: 1. Masslike hypoechoic endometrial thickening measuring up to 2 cm without increased vascularity. Sonographic findings are compatible with retained products of conception in the correct clinical setting. These results were called by telephone at the time of interpretation on 01/24/2017 at 3:54 pm to Dr. Payton Mccallum , who verbally acknowledged these results. Electronically Signed   By: Marin Roberts M.D.   On: 01/24/2017 16:05   US Ob Transvaginal  Result Date: 01/24/2017 CLINICAL DATA:  Vaginal bleeding. Status post EAB 01/06/2017. Residual beta HCG of 142. EXAM: OBSTETRIC <14 WK [place] AND TRANSVAGINAL OB US TECHNIQUE: Both transabdominal and transvaginal ultrasound examinations were performed for complete evaluation of the gestation as well as the maternal uterus, adnexal regions, and pelvic cul-de-sac. Transvaginal technique was performed to assess early pregnancy. COMPARISON:  None. FINDINGS: Intrauterine gestational sac: Not present Yolk sac:  Not present Embryo:  Not present Cardiac  Activity: Not present Maternal uterus/adnexae: Uterus is of normal size measuring 9.2 x 5.0 x 5.4 cm. Hypoechoic endometrial thickening measures up to 2.0 cm. There is no increased vascularity on color Doppler imaging. The right ovary is within normal limits. The left ovary is surgically absent. There is no significant free fluid. IMPRESSION: 1. Masslike hypoechoic endometrial thickening measuring up to 2 cm without increased vascularity. Sonographic findings are compatible with retained products of conception in the correct clinical setting. These results were called by telephone at the time of interpretation on 01/24/2017 at 3:54  pm to Dr. Payton Mccallum , who verbally acknowledged these results. Electronically Signed   By: Marin Roberts M.D.   On: 01/24/2017 16:05    Procedures Procedures (including critical care time)  Medications Ordered in UC Medications - No data to display   Initial Impression / Assessment and Plan / UC Course  I have reviewed the triage vital signs and the nursing notes.  Pertinent labs & imaging results that were available during my care of the patient were reviewed by me and considered in my medical decision making (see chart for details).       Final Clinical Impressions(s) / UC Diagnoses   Final diagnoses:  Vaginal bleeding  History of medical termination of pregnancy    New Prescriptions Discharge Medication List as of 01/24/2017  2:05 PM     1. Lab results and possible diagnosis reviewed with patient; recommend further evaluation with transvaginal ultrasound. Will have ultrasound done STAT at [facility]. Further management pending ultrasound results.   Addendum: informed patient of ultrasound results and need/recommendation for further evaluation and management at the ED. Patient verbalizes understanding and states will proceed to [facility] ED by private vehicle. Report called to [facility] ED triage RN.    Payton Mccallum, MD 01/24/17 1630

## 2017-01-24 NOTE — ED Triage Notes (Signed)
Patient states that she had an abortion on 01/06/2017. Patient states that she has been spotting and bleeding since. Patient states that she was Rx birth control on 01/08/2017 and is currently on the patch [employer]ico(Xulane). Patient states that she was unsure if she should still be spotting. Patient reports that she did have unprotected sex since having the abortion.

## 2017-01-24 NOTE — Discharge Instructions (Signed)
If needed you have been given one refill for the Cytotec which you can take three days after taking the first dose. Please seek medical attention for any high fevers, chest pain, shortness of breath, change in behavior, persistent vomiting, bloody stool or any other new or concerning symptoms.

## 2017-01-24 NOTE — ED Triage Notes (Signed)
Pt states that she was in the hospital earlier today having an ultrasound and she got a call from this hospital stating that they saw more products of conception in the womb from the abortion that she had on April 21st. Pt continues to have some vaginal bleeding.

## 2017-01-24 NOTE — Discharge Instructions (Signed)
Referred for urgent ulstrasound

## 2017-01-25 NOTE — Consult Note (Signed)
NAME:  Stacy Downs, Stacy Downs               ACCOUNT NO.:  [id number]  MEDICAL RECORD NO.:  [id number]  LOCATION:  ED26A                        FACILITY:  [facility]  PHYSICIAN:  Jennell Cornerhomas Schermerhorn, MDDATE OF BIRTH:  [birth year]  DATE OF CONSULTATION: DATE OF DISCHARGE:                                CONSULTATION   HISTORY OF PRESENT ILLNESS:  This is a 36 year old gravida 5, para 2, status post a voluntary [facility] of [facility]  The patient had some bleeding and cramping after the procedure, which has been dissipating since then. The patient states that she has some pinkish brownish discharge, is only changing her pad once a day just for sanitary purposes.  No significant pain.  No cramping.  No fever.  PAST MEDICAL HISTORY:  Lymphedema, scoliosis.  PAST SURGICAL HISTORY: 1. Exploratory laparotomy and left oophorectomy. 2. Voluntary interruption of pregnancy x3.  REVIEW OF SYSTEMS:  Unremarkable.  GYNECOLOGIC:  History of ovarian cyst, status post left oophorectomy.  MUSCULOSKELETAL:  Positive scoliosis.  FAMILY HISTORY:  No gynecologic cancer.  ALLERGIES:  SHRIMP.  NO KNOWN DRUG ALLERGIES.  SOCIAL HISTORY:  Smokes tobacco, currently using a patch.  Alcohol, occasional.  PHYSICAL EXAMINATION:  GENERAL:  Well-developed, well-nourished black female, in no acute distress. VITAL SIGNS:  Temperature 98.8, blood pressure 120/62. LUNGS:  Clear to auscultation. CARDIOVASCULAR:  Regular rate and rhythm.  No thyromegaly. ABDOMEN:  Soft, nontender, nondistended.  Vertical skin incision well healed. BIMANUAL PELVIC:  No cervical motion tenderness.  The cervix is closed. Uterus, normal size, shape, contour.  Adnexa, no mass.  Nontender. Vagina, brownish discharge.  No active blood noted.  DIAGNOSTIC DATA:  There was ultrasound that was performed today that showed uterus measuring 9.2 x 5.0 x 5.4 cm.  A hypoechoic  endometrial thickening 2 cm potentially consistent with retained products.  LABORATORY TESTING:  Blood type A positive.  Quantitative HCG 1-2.  CBC; white blood count of 9.9, hematocrit of 38.2, and platelets of 203,000.  ASSESSMENT:  Possible retained products of conception without cervical dilation and minimal vaginal bleeding.  PLAN:  I spoke to the patient regarding suction, dilation, and curettage versus medical management.  The patient opts for the medical management 800 mcg Cytotec vaginally tonight and repeat in 3 days if she does not pass tissue. Dr. Derrill KayGoodman will write for Vicodin 1 tablet q.4-6 hours as needed for pain, dispensed 5 tablets.  The patient is instructed to [facility] and advised them of our plan of action.  The patient may return to clinic if she has significant bleeding or pain.          ______________________________ Jennell Cornerhomas Schermerhorn, MD     TS/MEDQ  D:  01/24/2017  T:  01/25/2017  Job:  [id number]

## 2018-05-27 ENCOUNTER — Ambulatory Visit
Admission: EM | Admit: 2018-05-27 | Discharge: 2018-05-27 | Disposition: A | Discharge status: Home / Self Care | Payer: BLUE CROSS/BLUE SHIELD | Attending: Emergency Medicine | Admitting: Emergency Medicine

## 2018-05-27 ENCOUNTER — Other Ambulatory Visit: Payer: Self-pay

## 2018-05-27 ENCOUNTER — Encounter: Payer: Self-pay | Admitting: Emergency Medicine

## 2018-05-27 DIAGNOSIS — R0981 Nasal congestion: Secondary | ICD-10-CM | POA: Diagnosis not present

## 2018-05-27 DIAGNOSIS — J3489 Other specified disorders of nose and nasal sinuses: Secondary | ICD-10-CM

## 2018-05-27 NOTE — ED Triage Notes (Signed)
Patient in today c/o runny nose x 1 week. Patient denies fever. Patient has not tried any OTC medications.

## 2018-05-27 NOTE — Discharge Instructions (Addendum)
Recommend trial OTC Claritin 10mg  once daily. Symptoms may be due to allergies. May call [facility] for further evaluation of symptoms. Follow-up with ENT if needed.

## 2018-05-28 NOTE — ED Provider Notes (Signed)
[facility]    CSN: [id number] Arrival date & time: 05/27/18  1752     History   Chief Complaint Chief Complaint  Patient presents with  . Nasal Congestion    HPI Stacy Downs is a 37 y.o. female.   37 year old female presents with clear nasal drainage from right nostril for over 2 weeks. Occurs mostly when she bends over. Also having mild bilateral nasal congestion. Denies any fever, sinus pain or pressure, ear pain, cough or GI symptoms. She has not taken anything for symptoms. She looked up her symptoms on Google on the Internet and she is convinced she may have cancer. Does smoke cigarettes daily. No illicit drug use. Past history includes Lymphedema and Scoliosis and currently on Nexplanon but no other daily medications.   The history is provided by the patient.    Past Medical History:  Diagnosis Date  . Lymphedema   . Scoliosis     There are no active problems to display for this patient.   Past Surgical History:  Procedure Laterality Date  . OOPHORECTOMY Left     OB History   None      Home Medications    Prior to Admission medications   Medication Sig Start Date End Date Taking? Authorizing Provider  etonogestrel (NEXPLANON) 68 MG IMPL implant 1 each by Subdermal route once.   Yes [provider]    Family History Family History  Problem Relation Age of Onset  . Hypertension Mother   . Hyperlipidemia Mother   . Other Father        unknown medical history    Social History Social History   Tobacco Use  . Smoking status: Current Every Day Smoker    Packs/day: 0.50  . Smokeless tobacco: Never Used  Substance Use Topics  . Alcohol use: Yes    Comment: occasional  . Drug use: No     Allergies   Shrimp [shellfish allergy]   Review of Systems Review of Systems  Constitutional: Negative for activity change, appetite change, chills, fatigue, fever and unexpected weight change.  HENT: Positive for congestion,  postnasal drip and rhinorrhea. Negative for ear discharge, ear pain, facial swelling, mouth sores, nosebleeds, sinus pressure, sinus pain, sneezing, sore throat, tinnitus, trouble swallowing and voice change.   Eyes: Negative for pain, discharge, redness and itching.  Respiratory: Negative for cough, chest tightness, shortness of breath and wheezing.   Gastrointestinal: Negative for diarrhea, nausea and vomiting.  Musculoskeletal: Negative for arthralgias, myalgias, neck pain and neck stiffness.  Skin: Negative for color change, rash and wound.  Allergic/Immunologic: Positive for food allergies. Negative for environmental allergies and immunocompromised state.  Neurological: Negative for dizziness, tremors, seizures, syncope, facial asymmetry, speech difficulty, weakness, light-headedness, numbness and headaches.  Hematological: Negative for adenopathy. Does not bruise/bleed easily.  Psychiatric/Behavioral: The patient is nervous/anxious.      Physical Exam Triage Vital Signs ED Triage Vitals  Enc Vitals Group     BP 05/27/18 1813 117/68     Pulse Rate 05/27/18 1813 71     Resp 05/27/18 1813 16     Temp 05/27/18 1813 98.4 F (36.9 C)     Temp Source 05/27/18 1813 Oral     SpO2 05/27/18 1813 100 %     Weight 05/27/18 1813 178 lb (80.7 kg)     Height 05/27/18 1813 5\' 6"  (1.676 m)     Head Circumference --      Peak Flow --  Pain Score 05/27/18 1812 0     Pain Loc --      Pain Edu? --      Excl. in GC? --    No data found.  Updated Vital Signs BP 117/68 (BP Location: Left Arm)   Pulse 71   Temp 98.4 F (36.9 C) (Oral)   Resp 16   Ht 5\' 6"  (1.676 m)   Wt 178 lb (80.7 kg)   LMP 04/26/2018 (Approximate) Comment: implant  SpO2 100%   BMI 28.73 kg/m   Visual Acuity Right Eye Distance:   Left Eye Distance:   Bilateral Distance:    Right Eye Near:   Left Eye Near:    Bilateral Near:     Physical Exam  Constitutional: She is oriented to person, place, and time.  Vital signs are normal. She appears well-developed and well-nourished. She is cooperative. She does not appear ill. No distress.  Patient sitting comfortably on exam table in no acute distress.   HENT:  Head: Normocephalic and atraumatic.  Right Ear: Hearing, tympanic membrane, external ear and ear canal normal.  Left Ear: Hearing, tympanic membrane, external ear and ear canal normal.  Nose: Mucosal edema (slightly on right) and rhinorrhea (bilaterally) present. No sinus tenderness, nasal deformity, septal deviation or nasal septal hematoma. No epistaxis.  No foreign bodies. Right sinus exhibits no maxillary sinus tenderness and no frontal sinus tenderness. Left sinus exhibits no maxillary sinus tenderness and no frontal sinus tenderness.  Mouth/Throat: Uvula is midline, oropharynx is clear and moist and mucous membranes are normal.  Right nasal mucosa slightly red and more irritated but no distinct polyps/cysts or other foreign objects present.   Eyes: Conjunctivae and EOM are normal.  Neck: Normal range of motion. Neck supple.  Cardiovascular: Normal rate, regular rhythm and normal heart sounds.  No murmur heard. Pulmonary/Chest: Effort normal and breath sounds normal. No respiratory distress. She has no decreased breath sounds. She has no wheezes. She has no rhonchi. She has no rales.  Lymphadenopathy:    She has no cervical adenopathy.  Neurological: She is alert and oriented to person, place, and time. She has normal strength. No cranial nerve deficit or sensory deficit.  Skin: Skin is warm and dry. No rash noted.  Psychiatric: Her speech is normal and behavior is normal. Her mood appears anxious. Cognition and memory are normal.  Vitals reviewed.    UC Treatments / Results  Labs (all labs ordered are listed, but only abnormal results are displayed) Labs Reviewed - No data to display  EKG None  Radiology No results found.  Procedures Procedures (including critical care  time)  Medications Ordered in UC Medications - No data to display  Initial Impression / Assessment and Plan / UC Course  I have reviewed the triage vital signs and the nursing notes.  Pertinent labs & imaging results that were available during my care of the patient were reviewed by me and considered in my medical decision making (see chart for details).    Discussed with patient that she probably has a mild allergic rhinitis. No distinct abnormalities seen on exam that would suggest cancer or concerning etiology. Discussed that occasionally one nostril may be more symptomatic than other with allergy or viral illnesses. Strongly recommend patient trial OTC Claritin 10mg  once daily. Patient insists on seeing a specialist for another opinion. Recommend call [facility] tomorrow for follow-up.  Final Clinical Impressions(s) / UC Diagnoses   Final diagnoses:  Nasal drainage  Discharge Instructions     Recommend trial OTC Claritin 10mg  once daily. Symptoms may be due to allergies. May call [facility] for further evaluation of symptoms. Follow-up with ENT if needed.     ED Prescriptions    None     Controlled Substance Prescriptions [place] Controlled Substance Registry consulted? Not Applicable   Sudie Grumbling, NP 05/28/18 [phone]

## 2018-06-18 ENCOUNTER — Emergency Department: Payer: BLUE CROSS/BLUE SHIELD

## 2018-06-18 ENCOUNTER — Encounter: Payer: Self-pay | Admitting: Emergency Medicine

## 2018-06-18 ENCOUNTER — Emergency Department
Admission: EM | Admit: 2018-06-18 | Discharge: 2018-06-18 | Disposition: A | Discharge status: Home / Self Care | Payer: BLUE CROSS/BLUE SHIELD | Attending: Emergency Medicine | Admitting: Emergency Medicine

## 2018-06-18 DIAGNOSIS — Z79899 Other long term (current) drug therapy: Secondary | ICD-10-CM | POA: Insufficient documentation

## 2018-06-18 DIAGNOSIS — F17200 Nicotine dependence, unspecified, uncomplicated: Secondary | ICD-10-CM | POA: Insufficient documentation

## 2018-06-18 DIAGNOSIS — J209 Acute bronchitis, unspecified: Secondary | ICD-10-CM | POA: Diagnosis not present

## 2018-06-18 DIAGNOSIS — R232 Flushing: Secondary | ICD-10-CM | POA: Insufficient documentation

## 2018-06-18 DIAGNOSIS — R05 Cough: Secondary | ICD-10-CM | POA: Diagnosis present

## 2018-06-18 MED ORDER — TRAMADOL HCL 50 MG PO TABS: 50.0000 mg | ORAL_TABLET | Freq: Four times a day (QID) | ORAL | 0 refills | Status: DC | PRN

## 2018-06-18 MED ORDER — AZITHROMYCIN 250 MG PO TABS: ORAL_TABLET | ORAL | 0 refills | Status: DC

## 2018-06-18 MED ORDER — BENZONATATE 200 MG PO CAPS: 200.0000 mg | ORAL_CAPSULE | Freq: Three times a day (TID) | ORAL | 0 refills | Status: DC | PRN

## 2018-06-18 MED ORDER — IPRATROPIUM-ALBUTEROL 0.5-2.5 (3) MG/3ML IN SOLN
3.0000 mL | Freq: Once | RESPIRATORY_TRACT | Status: AC
Start: 2018-06-18 — End: 2018-06-18
  Administered 2018-06-18: 3 mL via RESPIRATORY_TRACT
  Filled 2018-06-18: qty 3

## 2018-06-18 MED ORDER — ALBUTEROL SULFATE HFA 108 (90 BASE) MCG/ACT IN AERS: 2.0000 | INHALATION_SPRAY | Freq: Four times a day (QID) | RESPIRATORY_TRACT | 2 refills | Status: DC | PRN

## 2018-06-18 NOTE — Discharge Instructions (Addendum)
With regular doctor the acute care if not better in 3 to 5 days.  Return emergency department if you are worsening.  Use medications as prescribed.  You should not return to work until he has not had a fever in 24 to 48 hours.

## 2018-06-18 NOTE — ED Triage Notes (Signed)
Pt reports started with sneezing and nasal congestion and now is coughing. Pt unsure of color of phlem because she is swallowing it. Pt reports started with fever last pm.

## 2018-06-18 NOTE — ED Notes (Signed)
See triage note  States she developed cough ,sneezing and congestion for a few days  States she had chills and hot flashes last pm  Afebrile on arrival   States her cough is occasionally prod  states hard for her to breath lying down  resp even and non labored on arrival

## 2018-06-18 NOTE — ED Provider Notes (Signed)
[facility] Emergency Department Provider Note  ____________________________________________   First MD Initiated Contact with Patient 06/18/18 [phone]     (approximate)  I have reviewed the triage vital signs and the nursing notes.   HISTORY  Chief Complaint Cough and Sinus Problem    HPI Stacy Downs is a 37 y.o. femalepresents emergency department complaining of cough and congestion with yellow to green mucus.  Symptoms for 3 days.  Complains of fever, chills, and body aches.  Denies chest pain or shortness of breath.  However she does hear wheezing.  She has had no vomiting or diarrhea.  No burning with urination.  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   Past Medical History:  Diagnosis Date  . Lymphedema   . Scoliosis     There are no active problems to display for this patient.   Past Surgical History:  Procedure Laterality Date  . OOPHORECTOMY Left     Prior to Admission medications   Medication Sig Start Date End Date Taking? Authorizing Provider  albuterol (PROVENTIL HFA;VENTOLIN HFA) 108 (90 Base) MCG/ACT inhaler Inhale 2 puffs into the lungs every 6 (six) hours as needed for wheezing or shortness of breath. 06/18/18   Aodhan Scheidt, Roselyn Bering, PA-C  azithromycin (ZITHROMAX Z-PAK) 250 MG tablet 2 pills today then 1 pill a day for 4 days 06/18/18   Sherrie Mustache Roselyn Bering, PA-C  benzonatate (TESSALON) 200 MG capsule Take 1 capsule (200 mg total) by mouth 3 (three) times daily as needed for cough. 06/18/18   Denee Boeder, Roselyn Bering, PA-C  etonogestrel (NEXPLANON) 68 MG IMPL implant 1 each by Subdermal route once.    [provider]  traMADol (ULTRAM) 50 MG tablet Take 1 tablet (50 mg total) by mouth every 6 (six) hours as needed. 06/18/18   Sherrie Mustache Roselyn Bering, PA-C    Allergies Shrimp [shellfish allergy]  Family History  Problem Relation Age of Onset  . Hypertension Mother   . Hyperlipidemia Mother   . Other  Father        unknown medical history    Social History Social History   Tobacco Use  . Smoking status: Current Every Day Smoker    Packs/day: 0.50  . Smokeless tobacco: Never Used  Substance Use Topics  . Alcohol use: Yes    Comment: occasional  . Drug use: No    Review of Systems  Constitutional: Positive fever/chills Eyes: No visual changes. ENT: No sore throat. Respiratory: Positive cough and congestion, positive wheezing Genitourinary: Negative for dysuria. Musculoskeletal: Negative for back pain. Skin: Negative for rash.    ____________________________________________   PHYSICAL EXAM:  VITAL SIGNS: ED Triage Vitals  Enc Vitals Group     BP 06/18/18 0833 105/70     Pulse Rate 06/18/18 0833 81     Resp 06/18/18 0833 20     Temp 06/18/18 0833 98.8 F (37.1 C)     Temp Source 06/18/18 0833 Oral     SpO2 06/18/18 0833 99 %     Weight 06/18/18 0833 178 lb (80.7 kg)     Height 06/18/18 0833 5\' 7"  (1.702 m)     Head Circumference --      Peak Flow --      Pain Score 06/18/18 0838 0     Pain Loc --      Pain Edu? --      Excl. in [place]? --     Constitutional: Alert  and oriented. Well appearing and in no acute distress. Eyes: Conjunctivae are normal.  Head: Atraumatic. ENT: TMS clear bilaterally Nose: No congestion/rhinnorhea. Mouth/Throat: Mucous membranes are moist.   NECK: Is supple, no lymphadenopathy is noted  cardiovascular: Normal rate, regular rhythm.  Heart sounds are normal Respiratory: Normal respiratory effort.  No retractions, lungs with wheezing in the left lung GU: deferred Musculoskeletal: FROM all extremities, warm and well perfused Neurologic:  Normal speech and language.  Skin:  Skin is warm, dry and intact. No rash noted. Psychiatric: Mood and affect are normal. Speech and behavior are normal.  ____________________________________________   LABS (all labs ordered are listed, but only abnormal results are displayed)  Labs Reviewed  - No data to display ____________________________________________   ____________________________________________  RADIOLOGY  Chest x-ray is negative  ____________________________________________   PROCEDURES  Procedure(s) performed: DuoNeb  Procedures    ____________________________________________   INITIAL IMPRESSION / ASSESSMENT AND PLAN / ED COURSE  Pertinent labs & imaging results that were available during my care of the patient were reviewed by me and considered in my medical decision making (see chart for details).   Patient is 37 year old female presents emergency department complaining of cough congestion, wheezing, and fever and chills.  On physical exam she appears well and her vitals are normal.  Lungs with wheezing along the left side.  Remainder the exam is unremarkable.  Chest x-ray is negative  DuoNeb given.  Patient states she feels much better after DuoNeb.  Lungs clear to auscultation.  Discussed the chest x-ray results with patient.  Explained to her that she has acute bronchitis instead of a pneumonia.  She was given a prescription for Z-Pak, Tessalon Perles, tramadol for the back pain due to the coughing, and an albuterol inhaler.  She is to follow-up with the acute care or her regular doctor if not better in 3 days.  Return emergency department if worsening.  She states she understands will comply with our instructions.  She was discharged in stable condition.     As part of my medical decision making, I reviewed the following data within the electronic MEDICAL RECORD NUMBER Nursing notes reviewed and incorporated, Old chart reviewed, Radiograph reviewed chest x-ray is negative, Notes from prior ED visits and [place] Controlled Substance Database  ____________________________________________   FINAL CLINICAL IMPRESSION(S) / ED DIAGNOSES  Final diagnoses:  Acute bronchitis, unspecified organism      NEW MEDICATIONS STARTED DURING THIS VISIT:  New  Prescriptions   ALBUTEROL (PROVENTIL HFA;VENTOLIN HFA) 108 (90 BASE) MCG/ACT INHALER    Inhale 2 puffs into the lungs every 6 (six) hours as needed for wheezing or shortness of breath.   AZITHROMYCIN (ZITHROMAX Z-PAK) 250 MG TABLET    2 pills today then 1 pill a day for 4 days   BENZONATATE (TESSALON) 200 MG CAPSULE    Take 1 capsule (200 mg total) by mouth 3 (three) times daily as needed for cough.   TRAMADOL (ULTRAM) 50 MG TABLET    Take 1 tablet (50 mg total) by mouth every 6 (six) hours as needed.     Note:  This document was prepared using Dragon voice recognition software and may include unintentional dictation errors.     Faythe Ghee, PA-C 06/18/18 1106    Jeanmarie Plant, MD 06/18/18 [phone]

## 2019-02-24 ENCOUNTER — Other Ambulatory Visit: Payer: Self-pay

## 2019-02-24 ENCOUNTER — Emergency Department
Admission: EM | Admit: 2019-02-24 | Discharge: 2019-02-24 | Disposition: A | Discharge status: Home / Self Care | Payer: BLUE CROSS/BLUE SHIELD | Attending: Emergency Medicine | Admitting: Emergency Medicine

## 2019-02-24 DIAGNOSIS — M545 Low back pain, unspecified: Secondary | ICD-10-CM

## 2019-02-24 DIAGNOSIS — Z87891 Personal history of nicotine dependence: Secondary | ICD-10-CM | POA: Diagnosis not present

## 2019-02-24 DIAGNOSIS — Z79899 Other long term (current) drug therapy: Secondary | ICD-10-CM | POA: Insufficient documentation

## 2019-02-24 MED ORDER — METHOCARBAMOL 500 MG PO TABS: 500.0000 mg | ORAL_TABLET | Freq: Four times a day (QID) | ORAL | 0 refills | Status: DC

## 2019-02-24 MED ORDER — DEXAMETHASONE SODIUM PHOSPHATE 10 MG/ML IJ SOLN
10.0000 mg | Freq: Once | INTRAMUSCULAR | Status: AC
  Administered 2019-02-24: 10 mg via INTRAMUSCULAR
  Filled 2019-02-24: qty 1

## 2019-02-24 MED ORDER — KETOROLAC TROMETHAMINE 30 MG/ML IJ SOLN
30.0000 mg | Freq: Once | INTRAMUSCULAR | Status: AC
  Administered 2019-02-24: 30 mg via INTRAMUSCULAR
  Filled 2019-02-24: qty 1

## 2019-02-24 NOTE — ED Notes (Signed)
See triage note  Having some back pain  Hx of same   States pain started about 2 weeks ago  Ambulates well

## 2019-02-24 NOTE — ED Provider Notes (Signed)
[facility] Emergency Department Provider Note  ____________________________________________  Time seen: Approximately 3:36 PM  I have reviewed the triage vital signs and the nursing notes.   HISTORY  Chief Complaint Back Pain    HPI Stacy Downs is a 38 y.o. female who presents emergency department complaining of lower back pain.  Patient reports that she has had increasing lower back pain for the past 2 weeks.  She has a history of scoliosis and states that she has flares every so often.  Patient reports that she has received a shot of Toradol, Norflex and Decadron in the past which completely alleviates her symptoms.  She is requesting same today.  Patient denies any bowel or bladder dysfunction, saddle anesthesia or paresthesias.  No recent trauma.  No urinary or GI complaints.         Past Medical History:  Diagnosis Date  . Lymphedema   . Scoliosis     There are no active problems to display for this patient.   Past Surgical History:  Procedure Laterality Date  . OOPHORECTOMY Left     Prior to Admission medications   Medication Sig Start Date End Date Taking? Authorizing Provider  albuterol (PROVENTIL HFA;VENTOLIN HFA) 108 (90 Base) MCG/ACT inhaler Inhale 2 puffs into the lungs every 6 (six) hours as needed for wheezing or shortness of breath. 06/18/18   Fisher, Linden Dolin, PA-C  etonogestrel (NEXPLANON) 68 MG IMPL implant 1 each by Subdermal route once.    [provider]  methocarbamol (ROBAXIN) 500 MG tablet Take 1 tablet (500 mg total) by mouth 4 (four) times daily. 02/24/19   Elick Aguilera, Charline Bills, PA-C    Allergies Shrimp [shellfish allergy]  Family History  Problem Relation Age of Onset  . Hypertension Mother   . Hyperlipidemia Mother   . Other Father        unknown medical history    Social History Social History   Tobacco Use  . Smoking status: Former Smoker    Packs/day: 0.50  . Smokeless tobacco: Never Used   Substance Use Topics  . Alcohol use: Yes    Comment: occasional  . Drug use: No     Review of Systems  Constitutional: No fever/chills Eyes: No visual changes.  Cardiovascular: no chest pain. Respiratory: no cough. No SOB. Gastrointestinal: No abdominal pain.  No nausea, no vomiting.  No diarrhea.  No constipation. Genitourinary: Negative for dysuria. No hematuria Musculoskeletal: Positive for lower back pain Skin: Negative for rash, abrasions, lacerations, ecchymosis. Neurological: Negative for headaches, focal weakness or numbness. 10-point ROS otherwise negative.  ____________________________________________   PHYSICAL EXAM:  VITAL SIGNS: ED Triage Vitals  Enc Vitals Group     BP 02/24/19 1501 108/64     Pulse Rate 02/24/19 1501 70     Resp 02/24/19 1501 16     Temp 02/24/19 1501 98.3 F (36.8 C)     Temp Source 02/24/19 1501 Oral     SpO2 02/24/19 1501 100 %     Weight 02/24/19 1502 177 lb (80.3 kg)     Height 02/24/19 1502 5\' 7"  (1.702 m)     Head Circumference --      Peak Flow --      Pain Score 02/24/19 1502 8     Pain Loc --      Pain Edu? --      Excl. in [place]? --      Constitutional: Alert and oriented. Well appearing and in no acute distress.  Eyes: Conjunctivae are normal. PERRL. EOMI. Head: Atraumatic. Neck: No stridor.    Cardiovascular: Normal rate, regular rhythm. Normal S1 and S2.  Good peripheral circulation. Respiratory: Normal respiratory effort without tachypnea or retractions. Lungs CTAB. Good air entry to the bases with no decreased or absent breath sounds. Gastrointestinal: Bowel sounds 4 quadrants. Soft and nontender to palpation. No guarding or rigidity. No palpable masses. No distention. No CVA tenderness. Musculoskeletal: Full range of motion to all extremities. No gross deformities appreciated.  Visualization of the lumbar spine reveals no visible abnormality.  Diffuse tenderness to palpation throughout the lumbar spine paraspinal  muscle groups without point specific tenderness.  No palpable abnormality or step-off.  No tenderness to palpation over bilateral sciatic notches.  Negative straight leg raise bilaterally.  Dorsalis pedis pulse intact bilateral lower extremities.  Sensation intact and equal bilateral lower extremities. Neurologic:  Normal speech and language. No gross focal neurologic deficits are appreciated.  Skin:  Skin is warm, dry and intact. No rash noted. Psychiatric: Mood and affect are normal. Speech and behavior are normal. Patient exhibits appropriate insight and judgement.   ____________________________________________   LABS (all labs ordered are listed, but only abnormal results are displayed)  Labs Reviewed - No data to display ____________________________________________  EKG   ____________________________________________  RADIOLOGY   No results found.  ____________________________________________    PROCEDURES  Procedure(s) performed:    Procedures    Medications  ketorolac (TORADOL) 30 MG/ML injection 30 mg (has no administration in time range)  dexamethasone (DECADRON) injection 10 mg (has no administration in time range)     ____________________________________________   INITIAL IMPRESSION / ASSESSMENT AND PLAN / ED COURSE  Pertinent labs & imaging results that were available during my care of the patient were reviewed by me and considered in my medical decision making (see chart for details).  Review of the [place] CSRS was performed in accordance of the [facility] prior to dispensing any controlled drugs.           Patient's diagnosis is consistent with acute low back pain.  Patient presented to the emergency department complaining of acute low back pain.  Patient has had intermittent low back pain in the past given her scoliosis.  Patient denies any recent injuries.  No urinary or GI complaints.  Exam was overall reassuring.  No indication for imaging or labs.   Patient is given Toradol and Decadron in the emergency department.. Patient will be discharged home with prescriptions for Robaxin. Patient is to follow up with orthopedics as needed or otherwise directed. Patient is given ED precautions to return to the ED for any worsening or new symptoms.     ____________________________________________  FINAL CLINICAL IMPRESSION(S) / ED DIAGNOSES  Final diagnoses:  Acute midline low back pain without sciatica      NEW MEDICATIONS STARTED DURING THIS VISIT:  ED Discharge Orders         Ordered    methocarbamol (ROBAXIN) 500 MG tablet  4 times daily     02/24/19 1556              This chart was dictated using voice recognition software/Dragon. Despite best efforts to proofread, errors can occur which can change the meaning. Any change was purely unintentional.    Racheal PatchesCuthriell, Auren Valdes D, PA-C 02/24/19 1557    Minna AntisPaduchowski, Kevin, MD 02/24/19 2321

## 2019-02-24 NOTE — ED Triage Notes (Signed)
Pt c/o lower back pain for the past 2 weeks, states she has issues with her back due to scoliosis

## 2019-04-11 ENCOUNTER — Emergency Department
Admission: EM | Admit: 2019-04-11 | Discharge: 2019-04-11 | Disposition: A | Discharge status: Home / Self Care | Payer: BLUE CROSS/BLUE SHIELD | Attending: Emergency Medicine | Admitting: Emergency Medicine

## 2019-04-11 ENCOUNTER — Encounter: Payer: Self-pay | Admitting: Physician Assistant

## 2019-04-11 ENCOUNTER — Other Ambulatory Visit: Payer: Self-pay

## 2019-04-11 DIAGNOSIS — Z87891 Personal history of nicotine dependence: Secondary | ICD-10-CM | POA: Diagnosis not present

## 2019-04-11 DIAGNOSIS — G8929 Other chronic pain: Secondary | ICD-10-CM

## 2019-04-11 DIAGNOSIS — M545 Low back pain, unspecified: Secondary | ICD-10-CM

## 2019-04-11 MED ORDER — ORPHENADRINE CITRATE 30 MG/ML IJ SOLN
60.0000 mg | INTRAMUSCULAR | Status: AC
  Administered 2019-04-11: 13:00:00 60 mg via INTRAMUSCULAR
  Filled 2019-04-11: qty 2

## 2019-04-11 MED ORDER — DICLOFENAC SODIUM 50 MG PO TBEC: 50.0000 mg | DELAYED_RELEASE_TABLET | Freq: Two times a day (BID) | ORAL | 0 refills | Status: AC

## 2019-04-11 MED ORDER — DEXAMETHASONE SODIUM PHOSPHATE 10 MG/ML IJ SOLN
10.0000 mg | Freq: Once | INTRAMUSCULAR | Status: AC
  Administered 2019-04-11: 10 mg via INTRAMUSCULAR
  Filled 2019-04-11: qty 1

## 2019-04-11 MED ORDER — TIZANIDINE HCL 4 MG PO TABS: 4.0000 mg | ORAL_TABLET | Freq: Every day | ORAL | 0 refills | Status: AC

## 2019-04-11 MED ORDER — KETOROLAC TROMETHAMINE 30 MG/ML IJ SOLN
30.0000 mg | Freq: Once | INTRAMUSCULAR | Status: AC
  Administered 2019-04-11: 13:00:00 30 mg via INTRAMUSCULAR
  Filled 2019-04-11: qty 1

## 2019-04-11 NOTE — Discharge Instructions (Addendum)
Take your Gabapentin (for nerve pain) morning and night Take the Diclofenac (for inflammation) morning and night Take the Tizanidine (for spasms) at bedtime  Follow-up with Reche Dixon, PA-C for recheck and to continue or change any medications. Consider physical therapy for additional relief of symptoms.

## 2019-04-11 NOTE — ED Provider Notes (Signed)
[facility] Emergency Department Provider Note ____________________________________________  Time seen: 1222  I have reviewed the triage vital signs and the nursing notes.  HISTORY  Chief Complaint  Back Pain  HPI Stacy Downs is a 38 y.o. female presents to the ED for evaluation and management of acute on chronic low back pain.  Patient describes symptoms assistant with her low back pain in the midline.  She apparently has mild scoliosis, but does not have any signs of discogenic pain.  She is been evaluated and followed by Ortho in the past and has recently completed prescriptions for cyclobenzaprine, meloxicam, and gabapentin.  Patient denies any interim injury, trauma, or fall patient denies any distal paresthesias, foot drop, saddle anesthesias, or incontinence.  She reports improvement of her acute flares with IM medications for his pain and spasm.  She is requesting the same at this time.  Past Medical History:  Diagnosis Date  . Lymphedema   . Scoliosis     There are no active problems to display for this patient.   Past Surgical History:  Procedure Laterality Date  . OOPHORECTOMY Left     Prior to Admission medications   Medication Sig Start Date End Date Taking? Authorizing Provider  albuterol (PROVENTIL HFA;VENTOLIN HFA) 108 (90 Base) MCG/ACT inhaler Inhale 2 puffs into the lungs every 6 (six) hours as needed for wheezing or shortness of breath. 06/18/18   Fisher, Linden Dolin, PA-C  diclofenac (VOLTAREN) 50 MG EC tablet Take 1 tablet (50 mg total) by mouth 2 (two) times daily. 04/11/19 05/11/19  Ambrosio Reuter, Dannielle Karvonen, PA-C  etonogestrel (NEXPLANON) 68 MG IMPL implant 1 each by Subdermal route once.    [provider]  tiZANidine (ZANAFLEX) 4 MG tablet Take 1 tablet (4 mg total) by mouth at bedtime. 04/11/19 05/11/19  Cabria Micalizzi, Dannielle Karvonen, PA-C    Allergies Shrimp [shellfish allergy]  Family History  Problem Relation Age of Onset  .  Hypertension Mother   . Hyperlipidemia Mother   . Other Father        unknown medical history    Social History Social History   Tobacco Use  . Smoking status: Former Smoker    Packs/day: 0.50  . Smokeless tobacco: Never Used  Substance Use Topics  . Alcohol use: Yes    Comment: occasional  . Drug use: No    Review of Systems  Constitutional: Negative for fever. Eyes: Negative for visual changes. ENT: Negative for sore throat. Cardiovascular: Negative for chest pain. Respiratory: Negative for shortness of breath. Gastrointestinal: Negative for abdominal pain, vomiting and diarrhea. Genitourinary: Negative for dysuria or retention. Musculoskeletal: Positive for back pain. Skin: Negative for rash. Neurological: Negative for headaches, focal weakness or numbness. ____________________________________________  PHYSICAL EXAM:  VITAL SIGNS: ED Triage Vitals  Enc Vitals Group     BP 04/11/19 1119 (!) 113/56     Pulse Rate 04/11/19 1119 81     Resp 04/11/19 1119 18     Temp 04/11/19 1119 97.7 F (36.5 C)     Temp Source 04/11/19 1119 Oral     SpO2 04/11/19 1119 100 %     Weight 04/11/19 1121 180 lb (81.6 kg)     Height 04/11/19 1121 5\' 8"  (1.727 m)     Head Circumference --      Peak Flow --      Pain Score 04/11/19 1121 7     Pain Loc --      Pain Edu? --  Excl. in [place]? --     Constitutional: Alert and oriented. Well appearing and in no distress. Head: Normocephalic and atraumatic. Eyes: Conjunctivae are normal. Normal extraocular movements Cardiovascular: Normal rate, regular rhythm. Normal distal pulses. Respiratory: Normal respiratory effort. No wheezes/rales/rhonchi. Gastrointestinal: Soft and nontender. No distention. Musculoskeletal: Normal spinal alignment without midline tenderness, spasm, deformity, or step-off.  Patient transitions from sit stand without assistance.  She is tender to palpation to the lumbar sacral junction in the midline.  She is able  to demonstrate normal flexion and extension range on exam.  Nontender with normal range of motion in all extremities.  Neurologic: Cranial nerves II through XII grossly intact.  Normal LE DTRs bilaterally.  Normal toe dorsiflexion foot eversion normal gait without ataxia. Normal speech and language. No gross focal neurologic deficits are appreciated. Skin:  Skin is warm, dry and intact. No rash noted. Psychiatric: Mood and affect are normal. Patient exhibits appropriate insight and judgment. ____________________________________________   RADIOLOGY  Not indicated ____________________________________________  PROCEDURES  Procedures Toradol 30 mg IM Norflex 60 mg IM Decadron 10 mg IM ____________________________________________  INITIAL IMPRESSION / ASSESSMENT AND PLAN / ED COURSE  Stacy Downs was evaluated in Emergency Department on 04/11/2019 for the symptoms described in the history of present illness. She was evaluated in the context of the global COVID-19 pandemic, which necessitated consideration that the patient might be at risk for infection with the SARS-CoV-2 virus that causes COVID-19. Institutional protocols and algorithms that pertain to the evaluation of patients at risk for COVID-19 are in a state of rapid change based on information released by regulatory bodies including the CDC and federal and state organizations. These policies and algorithms were followed during the patient's care in the ED.  Patient with ED evaluation of acute on chronic low back pain.  Patient's exam is overall benign and reassuring at this time.  No interim injury or worsening or progress should not of any neurological symptoms.  Symptoms seem to be consistent with lumbago with underlying mild scoliosis.  Patient's medications were changed at this time and she was encouraged to follow-up with her Ortho provider for further management.  Return precautions have been  reviewed. ____________________________________________  FINAL CLINICAL IMPRESSION(S) / ED DIAGNOSES  Final diagnoses:  Chronic midline low back pain without sciatica      Lissa HoardMenshew, Tricha Ruggirello V Bacon, PA-C 04/11/19 1329    Jeanmarie PlantMcShane, James A, MD 04/12/19 1336

## 2019-04-11 NOTE — ED Triage Notes (Signed)
Pt arrived c/o of back pain for the past 4 days. PT takes gabapentin and meloxicam for scoliosis. Pt states that she has been doing some exercises with family lately.

## 2019-04-11 NOTE — ED Notes (Signed)
See triage note  Presents with lower back pain   States she was seen for same and states pain was getting better.  States the pain started back about 4 days ago   Ambulates well to treatment room

## 2019-06-14 ENCOUNTER — Other Ambulatory Visit: Payer: Self-pay

## 2019-06-14 ENCOUNTER — Ambulatory Visit
Admission: EM | Admit: 2019-06-14 | Discharge: 2019-06-14 | Disposition: A | Discharge status: Home / Self Care | Payer: BLUE CROSS/BLUE SHIELD | Attending: Family Medicine | Admitting: Family Medicine

## 2019-06-14 ENCOUNTER — Encounter: Payer: Self-pay | Admitting: Emergency Medicine

## 2019-06-14 DIAGNOSIS — R682 Dry mouth, unspecified: Secondary | ICD-10-CM | POA: Diagnosis not present

## 2019-06-14 DIAGNOSIS — J392 Other diseases of pharynx: Secondary | ICD-10-CM | POA: Diagnosis not present

## 2019-06-14 LAB — RAPID STREP SCREEN (MED CTR MEBANE ONLY): Streptococcus, Group A Screen (Direct): NEGATIVE

## 2019-06-14 NOTE — ED Provider Notes (Signed)
[facility]    CSN: [id number] Arrival date & time: 06/14/19  1334      History   Chief Complaint Chief Complaint  Patient presents with  . Dry throat    HPI Kaleb Sedlacek is a 38 y.o. female.   38 yo female with a c/o dry throat for the past 2 weeks. States she finished a course of zithromax 5 days ago for an ear infection. Patient denies any fevers, pain with swallowing, shortness of breath, congestion, ear pain.      Past Medical History:  Diagnosis Date  . Lymphedema   . Scoliosis     There are no active problems to display for this patient.   Past Surgical History:  Procedure Laterality Date  . OOPHORECTOMY Left     OB History   No obstetric history on file.      Home Medications    Prior to Admission medications   Medication Sig Start Date End Date Taking? Authorizing Provider  etonogestrel (NEXPLANON) 68 MG IMPL implant 1 each by Subdermal route once.   Yes [provider]  NORTREL 0.5/35, 28, 0.5-35 MG-MCG tablet TAKE 1 TABLET BY MOUTH ONCE DAILY.TAKE 1 TWICE DAILY FOR 3 DAYS THEN 1 ONCE DAILY 06/02/19  Yes [provider]  albuterol (PROVENTIL HFA;VENTOLIN HFA) 108 (90 Base) MCG/ACT inhaler Inhale 2 puffs into the lungs every 6 (six) hours as needed for wheezing or shortness of breath. 06/18/18   Sherrie Mustache Roselyn Bering, PA-C    Family History Family History  Problem Relation Age of Onset  . Hypertension Mother   . Hyperlipidemia Mother   . Other Father        unknown medical history    Social History Social History   Tobacco Use  . Smoking status: Former Smoker    Packs/day: 0.50  . Smokeless tobacco: Never Used  Substance Use Topics  . Alcohol use: Yes    Comment: occasional  . Drug use: No     Allergies   Shrimp [shellfish allergy]   Review of Systems Review of Systems   Physical Exam Triage Vital Signs ED Triage Vitals  Enc Vitals Group     BP 06/14/19 1351 110/65     Pulse Rate 06/14/19 1351 63      Resp 06/14/19 1351 14     Temp 06/14/19 1351 98 F (36.7 C)     Temp Source 06/14/19 1351 Oral     SpO2 06/14/19 1351 100 %     Weight 06/14/19 1348 180 lb (81.6 kg)     Height 06/14/19 1348 5\' 8"  (1.727 m)     Head Circumference --      Peak Flow --      Pain Score 06/14/19 1348 0     Pain Loc --      Pain Edu? --      Excl. in [place]? --    No data found.  Updated Vital Signs BP 110/65 (BP Location: Left Arm)   Pulse 63   Temp 98 F (36.7 C) (Oral)   Resp 14   Ht 5\' 8"  (1.727 m)   Wt 81.6 kg   SpO2 100%   BMI 27.37 kg/m   Visual Acuity Right Eye Distance:   Left Eye Distance:   Bilateral Distance:    Right Eye Near:   Left Eye Near:    Bilateral Near:     Physical Exam Vitals signs and nursing note reviewed.  Constitutional:  General: She is not in acute distress.    Appearance: She is not toxic-appearing or diaphoretic.  HENT:     Nose: No congestion or rhinorrhea.     Mouth/Throat:     Mouth: Mucous membranes are moist.     Pharynx: Oropharynx is clear. No oropharyngeal exudate or posterior oropharyngeal erythema.  Neck:     Musculoskeletal: Neck supple.  Neurological:     Mental Status: She is alert.      UC Treatments / Results  Labs (all labs ordered are listed, but only abnormal results are displayed) Labs Reviewed  RAPID STREP SCREEN ([facility] ONLY)  CULTURE, GROUP A STREP [facility])    EKG   Radiology No results found.  Procedures Procedures (including critical care time)  Medications Ordered in UC Medications - No data to display  Initial Impression / Assessment and Plan / UC Course  I have reviewed the triage vital signs and the nursing notes.  Pertinent labs & imaging results that were available during my care of the patient were reviewed by me and considered in my medical decision making (see chart for details).      Final Clinical Impressions(s) / UC Diagnoses   Final diagnoses:  Dry throat     Discharge  Instructions     Biotene Oral Rinse (use as directed) Increase water intake     ED Prescriptions    None      1. diagnosis reviewed with patient 2. Recommend supportive treatment as above 3. Follow-up prn if symptoms worsen or don't improve  PDMP not reviewed this encounter.   Norval Gable, MD 06/14/19 [phone]

## 2019-06-14 NOTE — ED Triage Notes (Signed)
Patient states that she has had a dry throat for the past 2 weeks.  Patient states that she was treated with Azithromycin on 06/04/19.  Patient states that was around 06/09/19.  Patient denies fevers.

## 2019-06-14 NOTE — Discharge Instructions (Addendum)
Biotene Oral Rinse (use as directed) Increase water intake

## 2019-06-17 LAB — CULTURE, GROUP A STREP (THRC)

## 2019-07-08 ENCOUNTER — Other Ambulatory Visit: Payer: Self-pay

## 2019-07-08 ENCOUNTER — Emergency Department
Admission: EM | Admit: 2019-07-08 | Discharge: 2019-07-08 | Disposition: A | Discharge status: Home / Self Care | Payer: BLUE CROSS/BLUE SHIELD | Attending: Emergency Medicine | Admitting: Emergency Medicine

## 2019-07-08 ENCOUNTER — Emergency Department: Payer: BLUE CROSS/BLUE SHIELD

## 2019-07-08 DIAGNOSIS — R0789 Other chest pain: Secondary | ICD-10-CM

## 2019-07-08 DIAGNOSIS — R110 Nausea: Secondary | ICD-10-CM | POA: Insufficient documentation

## 2019-07-08 DIAGNOSIS — M549 Dorsalgia, unspecified: Secondary | ICD-10-CM | POA: Diagnosis present

## 2019-07-08 DIAGNOSIS — Z87891 Personal history of nicotine dependence: Secondary | ICD-10-CM | POA: Insufficient documentation

## 2019-07-08 LAB — COMPREHENSIVE METABOLIC PANEL
ALT: 17 U/L (ref 0–44)
AST: 16 U/L (ref 15–41)
Albumin: 3.9 g/dL (ref 3.5–5.0)
Alkaline Phosphatase: 62 U/L (ref 38–126)
Anion gap: 9 (ref 5–15)
BUN: 13 mg/dL (ref 6–20)
CO2: 27 mmol/L (ref 22–32)
Calcium: 9.2 mg/dL (ref 8.9–10.3)
Chloride: 102 mmol/L (ref 98–111)
Creatinine, Ser: 0.73 mg/dL (ref 0.44–1.00)
GFR calc Af Amer: >60 mL/min (ref >60)
GFR calc non Af Amer: >60 mL/min (ref >60)
Glucose, Bld: 111 mg/dL — ABNORMAL HIGH (ref 70–99)
Potassium: 3.9 mmol/L (ref 3.5–5.1)
Sodium: 138 mmol/L (ref 135–145)
Total Bilirubin: 0.8 mg/dL (ref 0.3–1.2)
Total Protein: 7.4 g/dL (ref 6.5–8.1)

## 2019-07-08 LAB — URINALYSIS, COMPLETE (UACMP) WITH MICROSCOPIC
Bacteria, UA: NONE SEEN
Bilirubin Urine: NEGATIVE
Glucose, UA: NEGATIVE mg/dL
Hgb urine dipstick: NEGATIVE
Ketones, ur: NEGATIVE mg/dL
Nitrite: NEGATIVE
Protein, ur: NEGATIVE mg/dL
Specific Gravity, Urine: 1.017 (ref 1.005–1.030)
pH: 6 (ref 5.0–8.0)

## 2019-07-08 LAB — CBC
HCT: 38.3 % (ref 36.0–46.0)
Hemoglobin: 12.6 g/dL (ref 12.0–15.0)
MCH: 32.5 pg (ref 26.0–34.0)
MCHC: 32.9 g/dL (ref 30.0–36.0)
MCV: 98.7 fL (ref 80.0–100.0)
Platelets: 185 10*3/uL (ref 150–400)
RBC: 3.88 MIL/uL (ref 3.87–5.11)
RDW: 11 % — ABNORMAL LOW (ref 11.5–15.5)
WBC: 8.5 10*3/uL (ref 4.0–10.5)
nRBC: 0 % (ref 0.0–0.2)

## 2019-07-08 LAB — FIBRIN DERIVATIVES D-DIMER (ARMC ONLY): Fibrin derivatives D-dimer (ARMC): 219.98 ng/mL (FEU) (ref 0.00–499.00)

## 2019-07-08 LAB — LIPASE, BLOOD: Lipase: 15 U/L (ref 11–51)

## 2019-07-08 LAB — POCT PREGNANCY, URINE: Preg Test, Ur: NEGATIVE

## 2019-07-08 LAB — TROPONIN I (HIGH SENSITIVITY): Troponin I (High Sensitivity): <2 ng/L (ref <18)

## 2019-07-08 MED ORDER — HYDROCODONE-ACETAMINOPHEN 5-325 MG PO TABS: 1.0000 | ORAL_TABLET | ORAL | 0 refills | Status: DC | PRN

## 2019-07-08 NOTE — ED Notes (Signed)
Patient in radiology at this time. 

## 2019-07-08 NOTE — ED Provider Notes (Signed)
[facility] Emergency Department Provider Note  Time seen: 2:56 PM  I have reviewed the triage vital signs and the nursing notes.   HISTORY  Chief Complaint Back Pain and Nausea   HPI Stacy Downs is a 38 y.o. female with a past medical history of lymphedema, scoliosis, presents to the emergency department for left posterior chest pain.  According to the patient for the past 2 to 3 days she has been experiencing left posterior chest pain worse with deep inspiration.  Denies any shortness of breath.  Denies any abdominal pain.  Denies any leg pain.  Has peripheral edema/lymphedema at baseline.  Denies any increased swelling.  Describes her chest pain as sharp and moderate to severe with deep inspiration or occasionally with certain movements of her chest.   Past Medical History:  Diagnosis Date  . Lymphedema   . Scoliosis     There are no active problems to display for this patient.   Past Surgical History:  Procedure Laterality Date  . OOPHORECTOMY Left     Prior to Admission medications   Medication Sig Start Date End Date Taking? Authorizing Provider  albuterol (PROVENTIL HFA;VENTOLIN HFA) 108 (90 Base) MCG/ACT inhaler Inhale 2 puffs into the lungs every 6 (six) hours as needed for wheezing or shortness of breath. 06/18/18   Fisher, Linden Dolin, PA-C  etonogestrel (NEXPLANON) 68 MG IMPL implant 1 each by Subdermal route once.    [provider]  NORTREL 0.5/35, 28, 0.5-35 MG-MCG tablet TAKE 1 TABLET BY MOUTH ONCE DAILY.TAKE 1 TWICE DAILY FOR 3 DAYS THEN 1 ONCE DAILY 06/02/19   [provider]    Allergies  Allergen Reactions  . Shrimp [Shellfish Allergy] Anaphylaxis    Family History  Problem Relation Age of Onset  . Hypertension Mother   . Hyperlipidemia Mother   . Other Father        unknown medical history    Social History Social History   Tobacco Use  . Smoking status: Former Smoker    Packs/day: 0.50  . Smokeless  tobacco: Never Used  Substance Use Topics  . Alcohol use: Yes    Comment: occasional  . Drug use: No    Review of Systems Constitutional: Negative for fever. Cardiovascular: Positive for posterior chest pain Respiratory: Negative for shortness of breath. Gastrointestinal: Negative for abdominal pain Musculoskeletal: Posterior chest wall pain. Skin: Negative for skin complaints  Neurological: Negative for headache All other ROS negative  ____________________________________________   PHYSICAL EXAM:  VITAL SIGNS: ED Triage Vitals  Enc Vitals Group     BP 07/08/19 1338 125/78     Pulse Rate 07/08/19 1338 71     Resp 07/08/19 1338 20     Temp 07/08/19 1338 98.5 F (36.9 C)     Temp Source 07/08/19 1338 Oral     SpO2 07/08/19 1338 100 %     Weight 07/08/19 1339 180 lb (81.6 kg)     Height 07/08/19 1339 5\' 7"  (1.702 m)     Head Circumference --      Peak Flow --      Pain Score 07/08/19 1339 7     Pain Loc --      Pain Edu? --      Excl. in [place]? --    Constitutional: Alert and oriented. Well appearing and in no distress. Eyes: Normal exam ENT      Head: Normocephalic and atraumatic.      Mouth/Throat: Mucous membranes are moist.  Cardiovascular: Normal rate, regular rhythm.  Respiratory: Normal respiratory effort without tachypnea nor retractions. Breath sounds are clear.  Very slight tenderness palpation of the left mid upper back. Gastrointestinal: Soft and nontender. No distention. Musculoskeletal: Nontender with normal range of motion in all extremities.  Lower extremity edema left greater than right which is baseline for the patient. Neurologic:  Normal speech and language. No gross focal neurologic deficits Skin:  Skin is warm, dry and intact.  Psychiatric: Mood and affect are normal.  ____________________________________________    EKG  EKG viewed and interpreted by myself shows a normal sinus rhythm at 71 bpm with a narrow QRS, normal axis, normal intervals,  no concerning ST changes.  ____________________________________________    RADIOLOGY  Chest x-ray negative  ____________________________________________   INITIAL IMPRESSION / ASSESSMENT AND PLAN / ED COURSE  Pertinent labs & imaging results that were available during my care of the patient were reviewed by me and considered in my medical decision making (see chart for details).   Patient presents emergency department for left upper back pain worse with deep inspiration.  Differential would include musculoskeletal pain, intercostal strain, pneumonia pneumothorax, ACS.  Patient's lab work is largely reassuring.  Chest x-ray is negative.  We will obtain a troponin as a precaution as well as a D-dimer.  Patient agreeable to plan of care.  Patient's work-up is reassuring.  D-dimer is negative.  Troponin is negative.  EKG is reassuring.  Highly suspect chest wall discomfort.  We will discharge the patient with PCP follow-up.  Stacy Downs was evaluated in Emergency Department on 07/08/2019 for the symptoms described in the history of present illness. She was evaluated in the context of the global COVID-19 pandemic, which necessitated consideration that the patient might be at risk for infection with the SARS-CoV-2 virus that causes COVID-19. Institutional protocols and algorithms that pertain to the evaluation of patients at risk for COVID-19 are in a state of rapid change based on information released by regulatory bodies including the CDC and federal and state organizations. These policies and algorithms were followed during the patient's care in the ED.  ____________________________________________   FINAL CLINICAL IMPRESSION(S) / ED DIAGNOSES  Posterior chest pain   Minna Antis, MD 07/08/19 1551

## 2019-07-08 NOTE — ED Triage Notes (Signed)
Patient reports pain in left side of back. Reports pain is worse with deep inspiration. Patient states she has been in PT and the therapist thinks she could have a rib out of place. Patient also reports nausea with one episode of vomiting today.

## 2020-08-02 ENCOUNTER — Other Ambulatory Visit: Payer: Self-pay | Admitting: Obstetrics and Gynecology

## 2020-08-02 DIAGNOSIS — Z1231 Encounter for screening mammogram for malignant neoplasm of breast: Secondary | ICD-10-CM

## 2020-09-13 ENCOUNTER — Other Ambulatory Visit: Payer: Self-pay

## 2020-09-13 ENCOUNTER — Ambulatory Visit
Admission: RE | Admit: 2020-09-13 | Discharge: 2020-09-13 | Disposition: A | Discharge status: Home / Self Care | Payer: Medicaid Other | Source: Ambulatory Visit | Attending: Obstetrics and Gynecology | Admitting: Obstetrics and Gynecology

## 2020-09-13 DIAGNOSIS — Z1231 Encounter for screening mammogram for malignant neoplasm of breast: Secondary | ICD-10-CM | POA: Insufficient documentation

## 2020-09-20 ENCOUNTER — Other Ambulatory Visit: Payer: Self-pay | Admitting: Obstetrics and Gynecology

## 2020-09-20 DIAGNOSIS — N6489 Other specified disorders of breast: Secondary | ICD-10-CM

## 2020-09-20 DIAGNOSIS — R928 Other abnormal and inconclusive findings on diagnostic imaging of breast: Secondary | ICD-10-CM

## 2020-09-28 ENCOUNTER — Ambulatory Visit
Admission: RE | Admit: 2020-09-28 | Discharge: 2020-09-28 | Disposition: A | Discharge status: Home / Self Care | Payer: Medicaid Other | Source: Ambulatory Visit | Attending: Obstetrics and Gynecology | Admitting: Obstetrics and Gynecology

## 2020-09-28 ENCOUNTER — Other Ambulatory Visit: Payer: Self-pay

## 2020-09-28 DIAGNOSIS — R928 Other abnormal and inconclusive findings on diagnostic imaging of breast: Secondary | ICD-10-CM | POA: Insufficient documentation

## 2020-09-28 DIAGNOSIS — N6489 Other specified disorders of breast: Secondary | ICD-10-CM | POA: Insufficient documentation

## 2021-03-06 ENCOUNTER — Emergency Department
Admission: EM | Admit: 2021-03-06 | Discharge: 2021-03-06 | Disposition: A | Discharge status: Home / Self Care | Payer: Medicaid Other | Attending: Emergency Medicine | Admitting: Emergency Medicine

## 2021-03-06 ENCOUNTER — Encounter: Payer: Self-pay | Admitting: Emergency Medicine

## 2021-03-06 ENCOUNTER — Other Ambulatory Visit: Payer: Self-pay

## 2021-03-06 DIAGNOSIS — M5441 Lumbago with sciatica, right side: Secondary | ICD-10-CM | POA: Insufficient documentation

## 2021-03-06 DIAGNOSIS — Z87891 Personal history of nicotine dependence: Secondary | ICD-10-CM | POA: Insufficient documentation

## 2021-03-06 DIAGNOSIS — M5442 Lumbago with sciatica, left side: Secondary | ICD-10-CM

## 2021-03-06 LAB — URINALYSIS, COMPLETE (UACMP) WITH MICROSCOPIC
Bacteria, UA: NONE SEEN
Bilirubin Urine: NEGATIVE
Glucose, UA: NEGATIVE mg/dL
Hgb urine dipstick: NEGATIVE
Ketones, ur: 5 mg/dL — AB
Leukocytes,Ua: NEGATIVE
Nitrite: NEGATIVE
Protein, ur: NEGATIVE mg/dL
Specific Gravity, Urine: 1.026 (ref 1.005–1.030)
pH: 5 (ref 5.0–8.0)

## 2021-03-06 LAB — POC URINE PREG, ED: Preg Test, Ur: NEGATIVE

## 2021-03-06 MED ORDER — METHOCARBAMOL 500 MG PO TABS: 500.0000 mg | ORAL_TABLET | Freq: Three times a day (TID) | ORAL | 0 refills | Status: AC | PRN

## 2021-03-06 MED ORDER — ORPHENADRINE CITRATE 30 MG/ML IJ SOLN
60.0000 mg | Freq: Two times a day (BID) | INTRAMUSCULAR | Status: DC
  Administered 2021-03-06: 60 mg via INTRAMUSCULAR
  Filled 2021-03-06: qty 2

## 2021-03-06 MED ORDER — DEXAMETHASONE SODIUM PHOSPHATE 10 MG/ML IJ SOLN
10.0000 mg | Freq: Once | INTRAMUSCULAR | Status: AC
  Administered 2021-03-06: 10 mg via INTRAMUSCULAR
  Filled 2021-03-06: qty 1

## 2021-03-06 MED ORDER — PREDNISONE 10 MG (21) PO TBPK: ORAL_TABLET | ORAL | 0 refills | Status: DC

## 2021-03-06 MED ORDER — HYDROXYZINE HCL 10 MG PO TABS: 10.0000 mg | ORAL_TABLET | Freq: Three times a day (TID) | ORAL | 0 refills | Status: AC | PRN

## 2021-03-06 MED ORDER — KETOROLAC TROMETHAMINE 30 MG/ML IJ SOLN
30.0000 mg | Freq: Once | INTRAMUSCULAR | Status: AC
  Administered 2021-03-06: 30 mg via INTRAMUSCULAR
  Filled 2021-03-06: qty 1

## 2021-03-06 NOTE — ED Provider Notes (Signed)
[facility]  ____________________________________________  Time seen: Approximately 11:40 PM  I have reviewed the triage vital signs and the nursing notes.   HISTORY  Chief Complaint Back Pain   Historian Patient    HPI Stacy Downs is a 40 y.o. female presents to the emergency department with right-sided low back pain that radiates down the posterior aspect of the right lower extremity.  Patient reports that she has had these symptoms multiple times in the past.  She reports that she receives Decadron, Norflex and Toradol injections and feels much improved.  She denies bowel or bladder incontinence or saddle anesthesia.  No dysuria, hematuria or increased urinary frequency.  No other alleviating measures have been attempted.   Past Medical History:  Diagnosis Date   Lymphedema    Scoliosis      Immunizations up to date:  Yes.     Past Medical History:  Diagnosis Date   Lymphedema    Scoliosis     There are no problems to display for this patient.   Past Surgical History:  Procedure Laterality Date   OOPHORECTOMY Left     Prior to Admission medications   Medication Sig Start Date End Date Taking? Authorizing Provider  hydrOXYzine (ATARAX/VISTARIL) 10 MG tablet Take 1 tablet (10 mg total) by mouth 3 (three) times daily as needed for up to 5 days. 03/06/21 03/11/21 Yes Pia Mau M, PA-C  methocarbamol (ROBAXIN) 500 MG tablet Take 1 tablet (500 mg total) by mouth every 8 (eight) hours as needed for up to 5 days. 03/06/21 03/11/21 Yes Pia Mau M, PA-C  predniSONE (STERAPRED UNI-PAK 21 TAB) 10 MG (21) TBPK tablet Take 6 tablets the first day, take 5 tablets the second day, take 4 tablets the third day, take 3 tablets the fourth day, take 2 tablets the fifth day, take 1 tablet the sixth day. 03/06/21  Yes Pia Mau M, PA-C  albuterol (PROVENTIL HFA;VENTOLIN HFA) 108 (90 Base) MCG/ACT inhaler Inhale 2 puffs into the lungs every 6 (six) hours as  needed for wheezing or shortness of breath. 06/18/18   Fisher, Roselyn Bering, PA-C  etonogestrel (NEXPLANON) 68 MG IMPL implant 1 each by Subdermal route once.    [provider]  HYDROcodone-acetaminophen (NORCO/VICODIN) 5-325 MG tablet Take 1 tablet by mouth every 4 (four) hours as needed. 07/08/19   Paduchowski, Caryn Bee, MD  NORTREL 0.5/35, 28, 0.5-35 MG-MCG tablet TAKE 1 TABLET BY MOUTH ONCE DAILY.TAKE 1 TWICE DAILY FOR 3 DAYS THEN 1 ONCE DAILY 06/02/19   [provider]    Allergies Shrimp [shellfish allergy]  Family History  Problem Relation Age of Onset   Hypertension Mother    Hyperlipidemia Mother    Other Father        unknown medical history   Breast cancer Neg Hx     Social History Social History   Tobacco Use   Smoking status: Former    Packs/day: 0.50    Pack years: 0.00    Types: Cigarettes   Smokeless tobacco: Never  Vaping Use   Vaping Use: Never used  Substance Use Topics   Alcohol use: Yes    Comment: occasional   Drug use: No     Review of Systems  Constitutional: No fever/chills Eyes:  No discharge ENT: No upper respiratory complaints. Respiratory: no cough. No SOB/ use of accessory muscles to breath Gastrointestinal:   No nausea, no vomiting.  No diarrhea.  No constipation. Musculoskeletal: Patient has low back pain. Skin: Negative for  rash, abrasions, lacerations, ecchymosis.   ____________________________________________   PHYSICAL EXAM:  VITAL SIGNS: ED Triage Vitals [03/06/21 1403]  Enc Vitals Group     BP 116/77     Pulse Rate 65     Resp 18     Temp 98.6 F (37 C)     Temp Source Oral     SpO2 100 %     Weight 179 lb 14.3 oz (81.6 kg)     Height 5\' 6"  (1.676 m)     Head Circumference      Peak Flow      Pain Score 8     Pain Loc      Pain Edu?      Excl. in [place]?      Constitutional: Alert and oriented. Well appearing and in no acute distress. Eyes: Conjunctivae are normal. PERRL. EOMI. Head:  Atraumatic. ENT:      Nose: No congestion/rhinnorhea.      Mouth/Throat: Mucous membranes are moist.  Neck: No stridor.  No cervical spine tenderness to palpation. Cardiovascular: Normal rate, regular rhythm. Normal S1 and S2.  Good peripheral circulation. Respiratory: Normal respiratory effort without tachypnea or retractions. Lungs CTAB. Good air entry to the bases with no decreased or absent breath sounds Gastrointestinal: Bowel sounds x 4 quadrants. Soft and nontender to palpation. No guarding or rigidity. No distention. Musculoskeletal: Full range of motion to all extremities. No obvious deformities noted.  Patient has paraspinal muscle tenderness on the right. Neurologic:  Normal for age. No gross focal neurologic deficits are appreciated.  Skin:  Skin is warm, dry and intact. No rash noted. Psychiatric: Mood and affect are normal for age. Speech and behavior are normal.   ____________________________________________   LABS (all labs ordered are listed, but only abnormal results are displayed)  Labs Reviewed  URINALYSIS, COMPLETE (UACMP) WITH MICROSCOPIC - Abnormal; Notable for the following components:      Result Value   Color, Urine YELLOW (*)    APPearance HAZY (*)    Ketones, ur 5 (*)    All other components within normal limits  POC URINE PREG, ED   ____________________________________________  EKG   ____________________________________________  RADIOLOGY   No results found.  ____________________________________________    PROCEDURES  Procedure(s) performed:     Procedures     Medications  ketorolac (TORADOL) 30 MG/ML injection 30 mg (30 mg Intramuscular Given 03/06/21 1637)  dexamethasone (DECADRON) injection 10 mg (10 mg Intramuscular Given 03/06/21 1637)     ____________________________________________   INITIAL IMPRESSION / ASSESSMENT AND PLAN / ED COURSE  Pertinent labs & imaging results that were available during my care of the patient  were reviewed by me and considered in my medical decision making (see chart for details).      Assessment and plan Lumbar radiculopathy 40 year old female presents to the emergency department with low back pain with right lower extremity radiculopathy.  Vital signs were reassuring at triage.  On physical exam, patient was alert, active and nontoxic-appearing.  Patient had a positive straight leg raise test on the right.  Urinalysis shows no signs of UTI.  Urine pregnancy test was negative.  Injections of Norflex, Decadron and Toradol were given.  Patient was discharged with tapered prednisone and Robaxin.  Return precautions were given to return with new or worsening symptoms.  Patient also requested a prescription to help her sleep.  I did prescribe patient a short course of hydroxyzine but advised patient to wait at least 30 minutes  after taking Robaxin before also taking hydroxyzine to avoid oversedation.     ____________________________________________  FINAL CLINICAL IMPRESSION(S) / ED DIAGNOSES  Final diagnoses:  Acute bilateral low back pain with left-sided sciatica      NEW MEDICATIONS STARTED DURING THIS VISIT:  ED Discharge Orders          Ordered    predniSONE (STERAPRED UNI-PAK 21 TAB) 10 MG (21) TBPK tablet        03/06/21 1703    methocarbamol (ROBAXIN) 500 MG tablet  Every 8 hours PRN        03/06/21 1703    hydrOXYzine (ATARAX/VISTARIL) 10 MG tablet  3 times daily PRN        03/06/21 1703                This chart was dictated using voice recognition software/Dragon. Despite best efforts to proofread, errors can occur which can change the meaning. Any change was purely unintentional.     Orvil Feil, PA-C 03/06/21 2344    Shaune Pollack, MD 03/07/21 1524

## 2021-03-06 NOTE — ED Notes (Signed)
Pt c/o lower back pain on the right side x 2 weeks. Pt states that it is getting worse. Pt states that she has hx/o same. Pt states that she does a lot of lifting at her job. Pt states that she has used ibuprofen and heating pad but it has not helped. Pt ambulatory into treatment room.

## 2021-03-06 NOTE — Discharge Instructions (Addendum)
Take Robaxin and prednisone for low back pain and sciatica. If you have trouble sleeping, you can take hydroxyzine. We did least 30 minutes after taking Robaxin to start hydroxyzine.

## 2021-03-06 NOTE — ED Triage Notes (Signed)
Pt reports that she developed lower right side back pain that shoots down her right leg. 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 She has tried Ibuprofen and heat but it is no longer helping.

## 2021-12-29 ENCOUNTER — Other Ambulatory Visit: Payer: Self-pay | Admitting: Obstetrics and Gynecology

## 2021-12-29 DIAGNOSIS — Z1231 Encounter for screening mammogram for malignant neoplasm of breast: Secondary | ICD-10-CM

## 2022-02-02 ENCOUNTER — Ambulatory Visit
Admission: RE | Admit: 2022-02-02 | Discharge: 2022-02-02 | Disposition: A | Discharge status: Home / Self Care | Payer: Medicaid Other | Source: Ambulatory Visit | Attending: Obstetrics and Gynecology | Admitting: Obstetrics and Gynecology

## 2022-02-02 DIAGNOSIS — Z1231 Encounter for screening mammogram for malignant neoplasm of breast: Secondary | ICD-10-CM | POA: Insufficient documentation

## 2022-02-06 ENCOUNTER — Emergency Department
Admission: EM | Admit: 2022-02-06 | Discharge: 2022-02-06 | Disposition: A | Discharge status: Home / Self Care | Payer: Medicaid Other | Attending: Emergency Medicine | Admitting: Emergency Medicine

## 2022-02-06 ENCOUNTER — Encounter: Payer: Self-pay | Admitting: Medical Oncology

## 2022-02-06 DIAGNOSIS — M545 Low back pain, unspecified: Secondary | ICD-10-CM | POA: Insufficient documentation

## 2022-02-06 MED ORDER — CYCLOBENZAPRINE HCL 10 MG PO TABS: 10.0000 mg | ORAL_TABLET | Freq: Three times a day (TID) | ORAL | 0 refills | Status: DC | PRN

## 2022-02-06 MED ORDER — CYCLOBENZAPRINE HCL 10 MG PO TABS
5.0000 mg | ORAL_TABLET | Freq: Once | ORAL | Status: AC
  Administered 2022-02-06: 5 mg via ORAL
  Filled 2022-02-06: qty 1

## 2022-02-06 MED ORDER — LIDOCAINE HCL (PF) 1 % IJ SOLN
2.0000 mL | Freq: Once | INTRAMUSCULAR | Status: AC
  Administered 2022-02-06: 2 mL via INTRADERMAL
  Filled 2022-02-06: qty 5

## 2022-02-06 MED ORDER — IBUPROFEN 600 MG PO TABS
600.0000 mg | ORAL_TABLET | Freq: Once | ORAL | Status: AC
  Administered 2022-02-06: 600 mg via ORAL
  Filled 2022-02-06: qty 1

## 2022-02-06 MED ORDER — DEXAMETHASONE SODIUM PHOSPHATE 10 MG/ML IJ SOLN
10.0000 mg | Freq: Once | INTRAMUSCULAR | Status: AC
  Administered 2022-02-06: 10 mg via INTRAVENOUS
  Filled 2022-02-06: qty 1

## 2022-02-06 NOTE — ED Provider Triage Note (Signed)
Emergency Medicine Provider Triage Evaluation Note  Stacy Downs , a 41 y.o. female  was evaluated in triage.  Pt complains of low back pain radiating into the right leg.  Patient has a history of scoliosis and sciatica.  Symptoms have been slowly worsening over the past several months.  No bowel bladder function, saddle anesthesia or paresthesia..  Review of Systems  Positive: Low back pain radiating into the right leg Negative: Bowel bladder dysfunction, saddle anesthesia, paresthesia  Physical Exam  BP 117/69 (BP Location: Right Arm)   Pulse 69   Temp 98.1 F (36.7 C) (Oral)   Resp 16   SpO2 100%  Gen:   Awake, no distress   Resp:  Normal effort  MSK:   Moves extremities without difficulty  Other:    Medical Decision Making  Medically screening exam initiated at 6:32 PM.  Appropriate orders placed.  Jorryn Bashor was informed that the remainder of the evaluation will be completed by another provider, this initial triage assessment does not replace that evaluation, and the importance of remaining in the ED until their evaluation is complete.  Patient presents with worsening chronic back pain.  History of scoliosis and sciatica.  Patient will have x-ray at this time.   Darletta Moll, PA-C 02/06/22 1832

## 2022-02-06 NOTE — ED Provider Notes (Signed)
[facility] Provider Note   Event Date/Time   First MD Initiated Contact with Patient 02/06/22 2240     (approximate) History  Back Pain  HPI Stacy Downs is a 41 y.o. female with a stated past medical history of scoliosis with subsequent chronic right-sided low back pain who presents acute exacerbation of this right-sided low back pain.  Patient denies any radiation down her right leg but does endorse pain into the upper part of the right buttock.  Patient denies any bowel/bladder incontinence, saddle anesthesia, or weakness/numbness/paresthesias in any extremity.  Patient denies any fevers or history of IV drug use Physical Exam  Triage Vital Signs: ED Triage Vitals  Enc Vitals Group     BP 02/06/22 1831 117/69     Pulse Rate 02/06/22 1831 69     Resp 02/06/22 1831 16     Temp 02/06/22 1831 98.1 F (36.7 C)     Temp Source 02/06/22 1831 Oral     SpO2 02/06/22 1831 100 %     Weight 02/06/22 1832 184 lb (83.5 kg)     Height 02/06/22 1832 5\' 6"  (1.676 m)     Head Circumference --      Peak Flow --      Pain Score 02/06/22 1832 7     Pain Loc --      Pain Edu? --      Excl. in [place]? --    Most recent vital signs: Vitals:   02/06/22 1831  BP: 117/69  Pulse: 69  Resp: 16  Temp: 98.1 F (36.7 C)  SpO2: 100%   General: Awake, oriented x4. CV:  Good peripheral perfusion.  Resp:  Normal effort.  Abd:  No distention.  Other:  Middle-aged African-American female laying in bed in no distress.  Tenderness to palpation over right lower lumbar paraspinal musculature ED Results / Procedures / Treatments   PROCEDURES: Critical Care performed: No Injection of joint  Date/Time: 02/06/2022 11:19 PM Performed by: 02/08/2022, MD Authorized by: Merwyn Katos, MD  Consent: Verbal consent obtained. Risks and benefits: risks, benefits and alternatives were discussed Consent given by: patient Patient identity confirmed: verbally with patient Preparation:  Patient was prepped and draped in the usual sterile fashion. Local anesthesia used: yes Anesthesia: local infiltration  Anesthesia: Local anesthesia used: yes Local Anesthetic: lidocaine 1% without epinephrine Anesthetic total: 2 mL  Sedation: Patient sedated: no  Patient tolerance: patient tolerated the procedure well with no immediate complications   MEDICATIONS ORDERED IN ED: Medications  cyclobenzaprine (FLEXERIL) tablet 5 mg (has no administration in time range)  ibuprofen (ADVIL) tablet 600 mg (has no administration in time range)  dexamethasone (DECADRON) injection 10 mg (10 mg Intravenous Given 02/06/22 2256)  lidocaine (PF) (XYLOCAINE) 1 % injection 2 mL (2 mLs Intradermal Given 02/06/22 2256)   IMPRESSION / MDM / ASSESSMENT AND PLAN / ED COURSE  I reviewed the triage vital signs and the nursing notes.                              Patient presents for low back pain. Given History and Exam the patient appears to be at low risk for Spinal Cord Compression Syndrome, Vertebral Malignancy/Mets, acute Spinal Fracture, Vertebral Osteomyelitis, Epidural Abscess, Infected or Obstructing Kidney Stone.  Their presentation appears most likely to be secondary to non-emergent musculoskeletal etiology vs non-emergent disc herniation.  ED Workup: Defer imaging and labwork  for outpatient follow up at this time. Back injection per procedure note with 1% lidocaine without epinephrine and Decadron Disposition: Discharge. Strict return precautions discussed with patient with full understanding. Advised patient to follow up promptly with primary care provider    FINAL CLINICAL IMPRESSION(S) / ED DIAGNOSES   Final diagnoses:  Acute right-sided low back pain without sciatica   Rx / DC Orders   ED Discharge Orders          Ordered    cyclobenzaprine (FLEXERIL) 10 MG tablet  3 times daily PRN        02/06/22 2316           Note:  This document was prepared using Dragon voice  recognition software and may include unintentional dictation errors.   Merwyn Katos, MD 02/06/22 [phone]

## 2022-02-06 NOTE — ED Triage Notes (Signed)
Pt reports that she has been having rt lower back pain for over a month. Has had this pain before, usually comes here and gets a steroid shot.

## 2022-02-07 ENCOUNTER — Other Ambulatory Visit: Payer: Self-pay | Admitting: Obstetrics and Gynecology

## 2022-02-07 DIAGNOSIS — N63 Unspecified lump in unspecified breast: Secondary | ICD-10-CM

## 2022-02-07 DIAGNOSIS — R928 Other abnormal and inconclusive findings on diagnostic imaging of breast: Secondary | ICD-10-CM

## 2022-02-21 ENCOUNTER — Ambulatory Visit
Admission: RE | Admit: 2022-02-21 | Discharge: 2022-02-21 | Disposition: A | Discharge status: Home / Self Care | Payer: Medicaid Other | Source: Ambulatory Visit | Attending: Obstetrics and Gynecology | Admitting: Obstetrics and Gynecology

## 2022-02-21 DIAGNOSIS — N63 Unspecified lump in unspecified breast: Secondary | ICD-10-CM | POA: Diagnosis present

## 2022-02-21 DIAGNOSIS — R928 Other abnormal and inconclusive findings on diagnostic imaging of breast: Secondary | ICD-10-CM | POA: Insufficient documentation

## 2022-04-10 ENCOUNTER — Other Ambulatory Visit: Payer: Self-pay | Admitting: Physical Medicine & Rehabilitation

## 2022-04-10 DIAGNOSIS — G8929 Other chronic pain: Secondary | ICD-10-CM

## 2022-04-18 ENCOUNTER — Ambulatory Visit: Payer: Medicaid Other

## 2022-04-24 ENCOUNTER — Ambulatory Visit (INDEPENDENT_AMBULATORY_CARE_PROVIDER_SITE_OTHER): Payer: Medicaid Other | Admitting: Nurse Practitioner

## 2022-04-24 ENCOUNTER — Encounter (INDEPENDENT_AMBULATORY_CARE_PROVIDER_SITE_OTHER): Payer: Self-pay | Admitting: Nurse Practitioner

## 2022-04-24 VITALS — BP 116/69 | HR 69 | Resp 17 | Ht 66.0 in | Wt 191.0 lb

## 2022-04-24 DIAGNOSIS — I890 Lymphedema, not elsewhere classified: Secondary | ICD-10-CM | POA: Diagnosis not present

## 2022-04-25 ENCOUNTER — Encounter (INDEPENDENT_AMBULATORY_CARE_PROVIDER_SITE_OTHER): Payer: Self-pay | Admitting: Nurse Practitioner

## 2022-04-25 NOTE — Progress Notes (Signed)
Subjective:    Patient ID: Stacy Downs, female    DOB: Jan 14, 1981, 41 y.o.   MRN: [id number] Chief Complaint  Patient presents with   Establish Care    Referred by Dr Andrey Spearman is a 41 year old female who presents today for evaluation of bilateral lower extremity edema.  The patient notes that she has had edema in her lower extremities for years.  She notes that typically the edema has always been better in the morning and worse throughout the day but she notes that recently the swelling has persisted more more.  Her largest concern is that it may be underlying blood clots.  Currently she works as a Scientist, clinical (histocompatibility and immunogenetics) and stands for long periods every day.  She notes that even with elevation the leg swelling has not been returning to normal.  She denies any open wounds or ulcerations.  She also notes that her daughters have lower extremity edema much like hers.    Review of Systems  Cardiovascular:  Positive for leg swelling.  Skin:  Negative for wound.  All other systems reviewed and are negative.      Objective:   Physical Exam Vitals reviewed.  HENT:     Head: Normocephalic.  Cardiovascular:     Rate and Rhythm: Normal rate.     Pulses: Normal pulses.  Pulmonary:     Effort: Pulmonary effort is normal.  Musculoskeletal:     Right lower leg: 1+ Edema present.     Left lower leg: 2+ Edema present.  Skin:    General: Skin is warm and dry.  Neurological:     Mental Status: She is alert and oriented to person, place, and time.  Psychiatric:        Mood and Affect: Mood normal.        Behavior: Behavior normal.        Thought Content: Thought content normal.        Judgment: Judgment normal.     BP 116/69 (BP Location: Right Arm)   Pulse 69   Resp 17   Ht 5\' 6"  (1.676 m)   Wt 191 lb (86.6 kg)   BMI 30.83 kg/m   Past Medical History:  Diagnosis Date   Lymphedema    Scoliosis     Social History   Socioeconomic History   Marital status: Single    Spouse  name: Not on file   Number of children: Not on file   Years of education: Not on file   Highest education level: Not on file  Occupational History   Not on file  Tobacco Use   Smoking status: Former    Packs/day: 0.50    Types: Cigarettes   Smokeless tobacco: Never  Vaping Use   Vaping Use: Never used  Substance and Sexual Activity   Alcohol use: Yes    Comment: occasional   Drug use: No   Sexual activity: Yes    Birth control/protection: None  Other Topics Concern   Not on file  Social History Narrative   Not on file   Social Determinants of Health   Financial Resource Strain: Not on file  Food Insecurity: Not on file  Transportation Needs: Not on file  Physical Activity: Not on file  Stress: Not on file  Social Connections: Not on file  Intimate Partner Violence: Not on file    Past Surgical History:  Procedure Laterality Date   OOPHORECTOMY Left     Family History  Problem Relation Age of Onset   Hypertension Mother    Hyperlipidemia Mother    Other Father        unknown medical history   Breast cancer Neg Hx     Allergies  Allergen Reactions   Shrimp [Shellfish Allergy] Anaphylaxis       Latest Ref Rng & Units 07/08/2019    1:46 PM 01/24/2017   12:45 PM  CBC  WBC 4.0 - 10.5 K/uL 8.5  9.9   Hemoglobin 12.0 - 15.0 g/dL 40.9  81.1   Hematocrit 36.0 - 46.0 % 38.3  38.2   Platelets 150 - 400 K/uL 185  203       CMP     Component Value Date/Time   NA 138 07/08/2019 1346   K 3.9 07/08/2019 1346   CL 102 07/08/2019 1346   CO2 27 07/08/2019 1346   GLUCOSE 111 (H) 07/08/2019 1346   BUN 13 07/08/2019 1346   CREATININE 0.73 07/08/2019 1346   CALCIUM 9.2 07/08/2019 1346   PROT 7.4 07/08/2019 1346   ALBUMIN 3.9 07/08/2019 1346   AST 16 07/08/2019 1346   ALT 17 07/08/2019 1346   ALKPHOS 62 07/08/2019 1346   BILITOT 0.8 07/08/2019 1346   GFRNONAA >60 07/08/2019 1346   GFRAA >60 07/08/2019 1346     No results found.     Assessment & Plan:    1. Lymphedema I discussed with the patient the need for medical grade compression with lymphedema.  We discussed the conservative therapies including use of medical grade compression.  She is advised utilize 20 to 30 mmHg.  She is advised to place them first thing in the morning and remove them before bed.  She is specifically instructed not to sleep in these.  Patient should also elevate her lower extremities when not active.  She should also endeavor to get approximately 15 to 20 minutes of activity 3 to 4 days/week.  Will have the patient return with a bilateral lower extremity venous reflux study to determine if there is a venous component to her swelling, given that she does stand for long periods and has risk factors for venous insufficiency.  We can also discuss a possible lymphedema pump as well when she returns.   Current Outpatient Medications on File Prior to Visit  Medication Sig Dispense Refill   etonogestrel (NEXPLANON) 68 MG IMPL implant 1 each by Subdermal route once.     LINZESS 72 MCG capsule Take 72 mcg by mouth daily.     meloxicam (MOBIC) 7.5 MG tablet Take 7.5 mg by mouth daily.     traZODone (DESYREL) 50 MG tablet Take 50 mg by mouth at bedtime.     No current facility-administered medications on file prior to visit.    There are no Patient Instructions on file for this visit. No follow-ups on file.   Georgiana Spinner, NP

## 2022-06-12 ENCOUNTER — Encounter (INDEPENDENT_AMBULATORY_CARE_PROVIDER_SITE_OTHER): Payer: Self-pay | Admitting: Vascular Surgery

## 2022-06-12 ENCOUNTER — Other Ambulatory Visit (INDEPENDENT_AMBULATORY_CARE_PROVIDER_SITE_OTHER): Payer: Self-pay | Admitting: Nurse Practitioner

## 2022-06-12 ENCOUNTER — Ambulatory Visit (INDEPENDENT_AMBULATORY_CARE_PROVIDER_SITE_OTHER): Payer: Medicaid Other | Admitting: Vascular Surgery

## 2022-06-12 ENCOUNTER — Ambulatory Visit (INDEPENDENT_AMBULATORY_CARE_PROVIDER_SITE_OTHER): Payer: Medicaid Other

## 2022-06-12 DIAGNOSIS — M7989 Other specified soft tissue disorders: Secondary | ICD-10-CM | POA: Diagnosis not present

## 2022-06-12 DIAGNOSIS — I890 Lymphedema, not elsewhere classified: Secondary | ICD-10-CM | POA: Diagnosis not present

## 2022-06-12 DIAGNOSIS — I872 Venous insufficiency (chronic) (peripheral): Secondary | ICD-10-CM

## 2022-06-13 ENCOUNTER — Encounter (INDEPENDENT_AMBULATORY_CARE_PROVIDER_SITE_OTHER): Payer: Self-pay | Admitting: Vascular Surgery

## 2022-06-13 DIAGNOSIS — I890 Lymphedema, not elsewhere classified: Secondary | ICD-10-CM | POA: Insufficient documentation

## 2022-06-13 DIAGNOSIS — I872 Venous insufficiency (chronic) (peripheral): Secondary | ICD-10-CM | POA: Insufficient documentation

## 2022-06-13 NOTE — Progress Notes (Signed)
MRN : [id number]  Stacy Downs is a 41 y.o. (24-Aug-1981) female who presents with chief complaint of legs swell.  History of Present Illness:  The patient returns to the office for followup evaluation regarding leg swelling.  She was seen initially one month ago.  Since that visit she has been using conservative measures to treat her swelling.  However, swelling has persisted and the pain associated with swelling continues. There have not been any interval development of a ulcerations or wounds.  Since the previous visit the patient has been wearing graduated compression stockings and has noted little if any improvement in the lymphedema. The patient has been using compression routinely morning until night.  The patient also states elevation during the day and routine exercise is being done too.   Venous duplex of the lower extremities No evidence of DVT bilaterally mild reflux in the distal GSV on the left.  Current Meds  Medication Sig   etonogestrel (NEXPLANON) 68 MG IMPL implant 1 each by Subdermal route once.   LINZESS 72 MCG capsule Take 72 mcg by mouth daily.   meloxicam (MOBIC) 7.5 MG tablet Take 7.5 mg by mouth daily.   traZODone (DESYREL) 50 MG tablet Take 50 mg by mouth at bedtime.    Past Medical History:  Diagnosis Date   Lymphedema    Scoliosis     Past Surgical History:  Procedure Laterality Date   OOPHORECTOMY Left     Social History Social History   Tobacco Use   Smoking status: Former    Packs/day: 0.50    Types: Cigarettes   Smokeless tobacco: Never  Vaping Use   Vaping Use: Never used  Substance Use Topics   Alcohol use: Yes    Comment: occasional   Drug use: No    Family History Family History  Problem Relation Age of Onset   Hypertension Mother    Hyperlipidemia Mother    Other Father        unknown medical history   Breast cancer Neg Hx     Allergies  Allergen Reactions   Shrimp [Shellfish Allergy] Anaphylaxis     REVIEW  OF SYSTEMS (Negative unless checked)  Constitutional: [] Weight loss  [] Fever  [] Chills Cardiac: [] Chest pain   [] Chest pressure   [] Palpitations   [] Shortness of breath when laying flat   [] Shortness of breath with exertion. Vascular:  [] Pain in legs with walking   [x] Pain in legs with standing  [] History of DVT   [] Phlebitis   [x] Swelling in legs   [] Varicose veins   [] Non-healing ulcers Pulmonary:   [] Uses home oxygen   [] Productive cough   [] Hemoptysis   [] Wheeze  [] COPD   [] Asthma Neurologic:  [] Dizziness   [] Seizures   [] History of stroke   [] History of TIA  [] Aphasia   [] Vissual changes   [] Weakness or numbness in arm   [] Weakness or numbness in leg Musculoskeletal:   [] Joint swelling   [] Joint pain   [] Low back pain Hematologic:  [] Easy bruising  [] Easy bleeding   [] Hypercoagulable state   [] Anemic Gastrointestinal:  [] Diarrhea   [] Vomiting  [] Gastroesophageal reflux/heartburn   [] Difficulty swallowing. Genitourinary:  [] Chronic kidney disease   [] Difficult urination  [] Frequent urination   [] Blood in urine Skin:  [] Rashes   [] Ulcers  Psychological:  [] History of anxiety   []  History of major depression.  Physical Examination  Vitals:   06/12/22 1454  BP: 101/66  Pulse: 62  Resp: 16  Weight: 190 lb (  86.2 kg)   Body mass index is 30.67 kg/m. Gen: WD/WN, NAD Head: [place]/AT, No temporalis wasting.  Ear/Nose/Throat: Hearing grossly intact, nares w/o erythema or drainage, pinna without lesions Eyes: PER, EOMI, sclera nonicteric.  Neck: Supple, no gross masses.  No JVD.  Pulmonary:  Good air movement, no audible wheezing, no use of accessory muscles.  Cardiac: RRR, precordium not hyperdynamic. Vascular:  scattered varicosities present bilaterally.  Mild venous stasis changes to the legs bilaterally.  3-4+ soft pitting edema  Vessel Right Left  Radial Palpable Palpable  Gastrointestinal: soft, non-distended. No guarding/no peritoneal signs.  Musculoskeletal: M/S 5/5 throughout.  No  deformity.  Neurologic: CN 2-12 intact. Pain and light touch intact in extremities.  Symmetrical.  Speech is fluent. Motor exam as listed above. Psychiatric: Judgment intact, Mood & affect appropriate for pt's clinical situation. Dermatologic: Venous rashes no ulcers noted.  No changes consistent with cellulitis. Lymph : No lichenification or skin changes of chronic lymphedema.  CBC Lab Results  Component Value Date   WBC 8.5 07/08/2019   HGB 12.6 07/08/2019   HCT 38.3 07/08/2019   MCV 98.7 07/08/2019   PLT 185 07/08/2019    BMET    Component Value Date/Time   NA 138 07/08/2019 1346   K 3.9 07/08/2019 1346   CL 102 07/08/2019 1346   CO2 27 07/08/2019 1346   GLUCOSE 111 (H) 07/08/2019 1346   BUN 13 07/08/2019 1346   CREATININE 0.73 07/08/2019 1346   CALCIUM 9.2 07/08/2019 1346   GFRNONAA >60 07/08/2019 1346   GFRAA >60 07/08/2019 1346   CrCl cannot be calculated (Patient's most recent lab result is older than the maximum 21 days allowed.).  COAG No results found for: "INR", "PROTIME"  Radiology VAS [place] LOWER EXTREMITY VENOUS REFLUX  Result Date: 06/12/2022  Lower Venous Reflux Study Patient Name:  Narely Niemann  Date of Exam:   06/12/2022 Medical Rec #: [id number]      Accession #:    [id number] Date of Birth: 27-Aug-1981       Patient Gender: F Patient Age:   37 years Exam Location:  [facility] Procedure:      VAS [place] LOWER EXTREMITY VENOUS REFLUX Referring Phys: Eulogio Ditch --------------------------------------------------------------------------------  Indications: Swelling, and varicosities.  Performing Technologist: Concha Norway RVT  Examination Guidelines: A complete evaluation includes B-mode imaging, spectral Doppler, color Doppler, and power Doppler as needed of all accessible portions of each vessel. Bilateral testing is considered an integral part of a complete examination. Limited examinations for reoccurring indications may be performed as noted. The reflux  portion of the exam is performed with the patient in reverse Trendelenburg. Significant venous reflux is defined as >500 ms in the superficial venous system, and >1 second in the deep venous system.  +--------------+---------+------+-----------+------------+--------+ LEFT          Reflux NoRefluxReflux TimeDiameter cmsComments                         Yes                                  +--------------+---------+------+-----------+------------+--------+ GSV at SFJ              yes    >500 ms      .38              +--------------+---------+------+-----------+------------+--------+ GSV prox thighno                            .  43              +--------------+---------+------+-----------+------------+--------+ GSV mid thigh no                            .51              +--------------+---------+------+-----------+------------+--------+ GSV dist thigh          yes    >500 ms      .40              +--------------+---------+------+-----------+------------+--------+ GSV at knee             yes    >500 ms      .36              +--------------+---------+------+-----------+------------+--------+ GSV prox calf no                            .38              +--------------+---------+------+-----------+------------+--------+   Summary: Right: - No evidence of deep vein thrombosis seen in the right lower extremity, from the common femoral through the popliteal veins. - No evidence of superficial venous thrombosis in the right lower extremity. - There is no evidence of venous reflux seen in the right lower extremity. - No evidence of superficial venous reflux seen in the right greater saphenous vein. - No evidence of superficial venous reflux seen in the right short saphenous vein.  Left: - No evidence of deep vein thrombosis seen in the left lower extremity, from the common femoral through the popliteal veins. - No evidence of superficial venous thrombosis in the left lower  extremity. - Venous reflux is noted in the left sapheno-femoral junction. - Venous reflux is noted in the left greater saphenous vein in the thigh.  *See table(s) above for measurements and observations. Electronically signed by Hortencia Pilar MD on 06/12/2022 at 5:31:38 PM.    Final      Assessment/Plan 1. Swelling of lower leg Recommend:  No surgery or intervention at this point in time.    I have reviewed my discussion with the patient regarding lymphedema and why it  causes symptoms.  Patient will continue wearing graduated compression on a daily basis. The patient should put the compression on first thing in the morning and removing them in the evening. The patient should not sleep in the compression.   In addition, behavioral modification throughout the day will be continued.  This will include frequent elevation (such as in a recliner), use of over the counter pain medications as needed and exercise such as walking.  The systemic causes for chronic edema such as liver, kidney and cardiac etiologies do not appear to have significant changed over the past year.    Despite conservative treatments including graduated compression therapy class 1 and behavioral modification including exercise and elevation the patient  has not obtained adequate control of the lymphedema.  The patient still has stage 3 lymphedema and therefore, I believe that a lymph pump should be added to improve the control of the patient's lymphedema.  Additionally, a lymph pump is warranted because it will reduce the risk of cellulitis and ulceration in the future.  Patient should follow-up in six months   - VAS [place] LOWER EXTREMITY VENOUS REFLUX  2. Chronic venous insufficiency Recommend:  No surgery or intervention at this point in time.  I have reviewed my discussion with the patient regarding lymphedema and why it  causes symptoms.  Patient will continue wearing graduated compression on a daily basis. The patient  should put the compression on first thing in the morning and removing them in the evening. The patient should not sleep in the compression.   In addition, behavioral modification throughout the day will be continued.  This will include frequent elevation (such as in a recliner), use of over the counter pain medications as needed and exercise such as walking.  The systemic causes for chronic edema such as liver, kidney and cardiac etiologies do not appear to have significant changed over the past year.    Despite conservative treatments including graduated compression therapy class 1 and behavioral modification including exercise and elevation the patient  has not obtained adequate control of the lymphedema.  The patient still has stage 3 lymphedema and therefore, I believe that a lymph pump should be added to improve the control of the patient's lymphedema.  Additionally, a lymph pump is warranted because it will reduce the risk of cellulitis and ulceration in the future.  Patient should follow-up in six months    3. Lymphedema Recommend:  No surgery or intervention at this point in time.    I have reviewed my discussion with the patient regarding lymphedema and why it  causes symptoms.  Patient will continue wearing graduated compression on a daily basis. The patient should put the compression on first thing in the morning and removing them in the evening. The patient should not sleep in the compression.   In addition, behavioral modification throughout the day will be continued.  This will include frequent elevation (such as in a recliner), use of over the counter pain medications as needed and exercise such as walking.  The systemic causes for chronic edema such as liver, kidney and cardiac etiologies do not appear to have significant changed over the past year.    Despite conservative treatments including graduated compression therapy class 1 and behavioral modification including exercise  and elevation the patient  has not obtained adequate control of the lymphedema.  The patient still has stage 3 lymphedema and therefore, I believe that a lymph pump should be added to improve the control of the patient's lymphedema.  Additionally, a lymph pump is warranted because it will reduce the risk of cellulitis and ulceration in the future.  Patient should follow-up in six months      Hortencia Pilar, MD  06/13/2022 6:37 PM

## 2022-07-17 ENCOUNTER — Encounter (INDEPENDENT_AMBULATORY_CARE_PROVIDER_SITE_OTHER): Payer: Self-pay

## 2023-05-04 ENCOUNTER — Emergency Department
Admission: EM | Admit: 2023-05-04 | Discharge: 2023-05-04 | Disposition: A | Discharge status: Home / Self Care | Payer: Medicaid Other | Attending: Emergency Medicine | Admitting: Emergency Medicine

## 2023-05-04 ENCOUNTER — Other Ambulatory Visit: Payer: Self-pay

## 2023-05-04 DIAGNOSIS — M545 Low back pain, unspecified: Secondary | ICD-10-CM | POA: Insufficient documentation

## 2023-05-04 MED ORDER — KETOROLAC TROMETHAMINE 30 MG/ML IJ SOLN
30.0000 mg | Freq: Once | INTRAMUSCULAR | Status: AC
  Administered 2023-05-04: 30 mg via INTRAMUSCULAR
  Filled 2023-05-04: qty 1

## 2023-05-04 MED ORDER — DEXAMETHASONE SODIUM PHOSPHATE 10 MG/ML IJ SOLN
10.0000 mg | Freq: Once | INTRAMUSCULAR | Status: AC
  Administered 2023-05-04: 10 mg via INTRAMUSCULAR
  Filled 2023-05-04: qty 1

## 2023-05-04 MED ORDER — METHYLPREDNISOLONE 4 MG PO TBPK: ORAL_TABLET | ORAL | 0 refills | Status: DC

## 2023-05-04 NOTE — ED Provider Notes (Signed)
[facility] Provider Note  Patient Contact: 10:30 PM (approximate)   History   Back Pain   HPI  Stacy Downs is a 42 y.o. female with a history of chronic low back pain, presents to the emergency department with persistent low back pain.  Patient was seen by her PCP earlier today and was prescribed tizanidine, tramadol and meloxicam.  Patient reports that she has not picked up these medications and states that she knows that they will work as she has tried them in the past.  She denies bowel or bladder incontinence or saddle anesthesia.  Patient is ambulatory.  She reports that the pain occasionally radiates into her left buttocks but does not radiate along the posterior aspect of her lower extremity.  No dysuria, hematuria or increased urinary frequency.  Patient a urinalysis at her PCP office today which was reassuring.      Physical Exam   Triage Vital Signs: ED Triage Vitals  Encounter Vitals Group     BP 05/04/23 2113 116/69     Systolic BP Percentile --      Diastolic BP Percentile --      Pulse Rate 05/04/23 2113 82     Resp 05/04/23 2113 17     Temp 05/04/23 2113 98.5 F (36.9 C)     Temp Source 05/04/23 2113 Oral     SpO2 05/04/23 2113 99 %     Weight --      Height --      Head Circumference --      Peak Flow --      Pain Score 05/04/23 2114 9     Pain Loc --      Pain Education --      Exclude from Growth Chart --     Most recent vital signs: Vitals:   05/04/23 2113  BP: 116/69  Pulse: 82  Resp: 17  Temp: 98.5 F (36.9 C)  SpO2: 99%     General: Alert and in no acute distress. Eyes:  PERRL. EOMI. Head: No acute traumatic findings ENT:      Nose: No congestion/rhinnorhea.      Mouth/Throat: Mucous membranes are moist.  Neck: No stridor. No cervical spine tenderness to palpation. Cardiovascular:  Good peripheral perfusion Respiratory: Normal respiratory effort without tachypnea or retractions. Lungs CTAB. Good air entry  to the bases with no decreased or absent breath sounds. Gastrointestinal: Bowel sounds 4 quadrants. Soft and nontender to palpation. No guarding or rigidity. No palpable masses. No distention. No CVA tenderness. Musculoskeletal: Full range of motion to all extremities.  Neurologic:  No gross focal neurologic deficits are appreciated.  Skin:   No rash noted    ED Results / Procedures / Treatments   Labs (all labs ordered are listed, but only abnormal results are displayed) Labs Reviewed - No data to display      PROCEDURES:  Critical Care performed: No  Procedures   MEDICATIONS ORDERED IN ED: Medications  ketorolac (TORADOL) 30 MG/ML injection 30 mg (has no administration in time range)  dexamethasone (DECADRON) injection 10 mg (has no administration in time range)     IMPRESSION / MDM / ASSESSMENT AND PLAN / ED COURSE  I reviewed the triage vital signs and the nursing notes.                              Assessment and plan Low back pain 42 year old female  presents to the emergency department with chronic low back pain.  Vital signs are reassuring at triage.  On exam, patient was alert, active and nontoxic-appearing.  Patient was able to stand easily and can ambulate without difficulty.  She had symmetric strength in the lower extremities.  Patient was given an injection of Toradol and Decadron at her request and was discharged with Medrol Dosepak.  Return precautions were given to return with new or worsening symptoms.  All patient questions were answered.   FINAL CLINICAL IMPRESSION(S) / ED DIAGNOSES   Final diagnoses:  Acute bilateral low back pain without sciatica     Rx / DC Orders   ED Discharge Orders          Ordered    methylPREDNISolone (MEDROL DOSEPAK) 4 MG TBPK tablet        05/04/23 2229             Note:  This document was prepared using Dragon voice recognition software and may include unintentional dictation errors.   Pia Mau  Stewart, Cordelia Poche 05/04/23 2234    Chesley Noon, MD 05/05/23 2337

## 2023-05-04 NOTE — ED Triage Notes (Signed)
PT c/o increased lower back pain x2 days that has not improved with rest or muscle relaxer's and OTC medication at home. PT has a history of chronic back pain. Pt states she's had to have injections in the past to relieve s/s.

## 2023-05-04 NOTE — Discharge Instructions (Signed)
Take Medrol dose pack as directed.   

## 2023-07-23 ENCOUNTER — Other Ambulatory Visit: Payer: Self-pay

## 2023-07-23 ENCOUNTER — Emergency Department
Admission: EM | Admit: 2023-07-23 | Discharge: 2023-07-23 | Disposition: A | Discharge status: Home / Self Care | Payer: Medicaid Other | Attending: Emergency Medicine | Admitting: Emergency Medicine

## 2023-07-23 DIAGNOSIS — S1096XA Insect bite of unspecified part of neck, initial encounter: Secondary | ICD-10-CM | POA: Insufficient documentation

## 2023-07-23 DIAGNOSIS — W57XXXA Bitten or stung by nonvenomous insect and other nonvenomous arthropods, initial encounter: Secondary | ICD-10-CM | POA: Diagnosis not present

## 2023-07-23 NOTE — ED Notes (Signed)
See triage note  Presents with possible insect bite to left side of neck  Small red area note  Min swelling

## 2023-07-23 NOTE — ED Triage Notes (Signed)
Pt to ED via POV c/o insect bite on left side of neck. Pt was bit last night. Pt reports it welted up a bit. No swelling at this time, just pinpoint red dot visible. Denies any pain, just feels warmth.

## 2023-07-23 NOTE — ED Provider Notes (Signed)
[facility] Provider Note    Event Date/Time   First MD Initiated Contact with Patient 07/23/23 [phone]     (approximate)   History   Insect Bite   HPI  Stacy Downs is a 42 y.o. female with no significant past medical history presents emergency department with complaints of a bug bite to the left side of her neck which occurred yesterday.  Patient states that something hit her neck.  Has a very small area today.  States it did welt up yesterday.  Patient has picture of the bug on her phone.  Appears to be more of a Grasshopper or praying mantis.      Physical Exam   Triage Vital Signs: ED Triage Vitals  Encounter Vitals Group     BP 07/23/23 0659 115/60     Systolic BP Percentile --      Diastolic BP Percentile --      Pulse Rate 07/23/23 0659 90     Resp 07/23/23 0659 18     Temp 07/23/23 0659 98.5 F (36.9 C)     Temp Source 07/23/23 0659 Oral     SpO2 07/23/23 0659 97 %     Weight 07/23/23 0715 190 lb 0.6 oz (86.2 kg)     Height 07/23/23 0715 5\' 6"  (1.676 m)     Head Circumference --      Peak Flow --      Pain Score 07/23/23 0653 0     Pain Loc --      Pain Education --      Exclude from Growth Chart --     Most recent vital signs: Vitals:   07/23/23 0659  BP: 115/60  Pulse: 90  Resp: 18  Temp: 98.5 F (36.9 C)  SpO2: 97%     General: Awake, no distress.   CV:  Good peripheral perfusion. regular rate and  rhythm Resp:  Normal effort.  Abd:  No distention.   Other:  Skin with no noticeable redness swelling.  Area is a little tender to palpation.   ED Results / Procedures / Treatments   Labs (all labs ordered are listed, but only abnormal results are displayed) Labs Reviewed - No data to display   EKG     RADIOLOGY     PROCEDURES:   Procedures   MEDICATIONS ORDERED IN ED: Medications - No data to display   IMPRESSION / MDM / ASSESSMENT AND PLAN / ED COURSE  I reviewed the triage vital signs and the  nursing notes.                              Differential diagnosis includes, but is not limited to, insect bite, cellulitis, allergic reaction  Patient's presentation is most consistent with acute, uncomplicated illness.   The patient's exam is benign.  Did explain to her she could apply Benadryl or hydrocortisone cream.  Apply ice.  Follow-up with her doctor if not improving to 3 days.  I do not feel that antibiotic or steroid is indicated at this time.  Patient is asking for work note.  Discharged stable condition.      FINAL CLINICAL IMPRESSION(S) / ED DIAGNOSES   Final diagnoses:  Bug bite, initial encounter     Rx / DC Orders   ED Discharge Orders     None        Note:  This document was prepared using Dragon  voice recognition software and may include unintentional dictation errors.    Faythe Ghee, PA-C 07/23/23 1610    Minna Antis, MD 07/23/23 1459

## 2023-07-23 NOTE — Discharge Instructions (Signed)
Apply ice as needed Take benadryl / use hydrocortisone cream as needed

## 2023-07-25 IMAGING — MG MM DIGITAL SCREENING BILAT W/ TOMO AND CAD
8 series · 8 of 24 positions shown · non-contrast
Comparison: Previous exam(s).

CLINICAL DATA: Screening.

EXAM:
DIGITAL SCREENING BILATERAL MAMMOGRAM WITH TOMOSYNTHESIS AND CAD
TECHNIQUE: Bilateral screening digital craniocaudal and mediolateral oblique
mammograms were obtained. Bilateral screening digital breast
tomosynthesis was performed. The images were evaluated with
computer-aided detection.

[L MLO synth-2D]
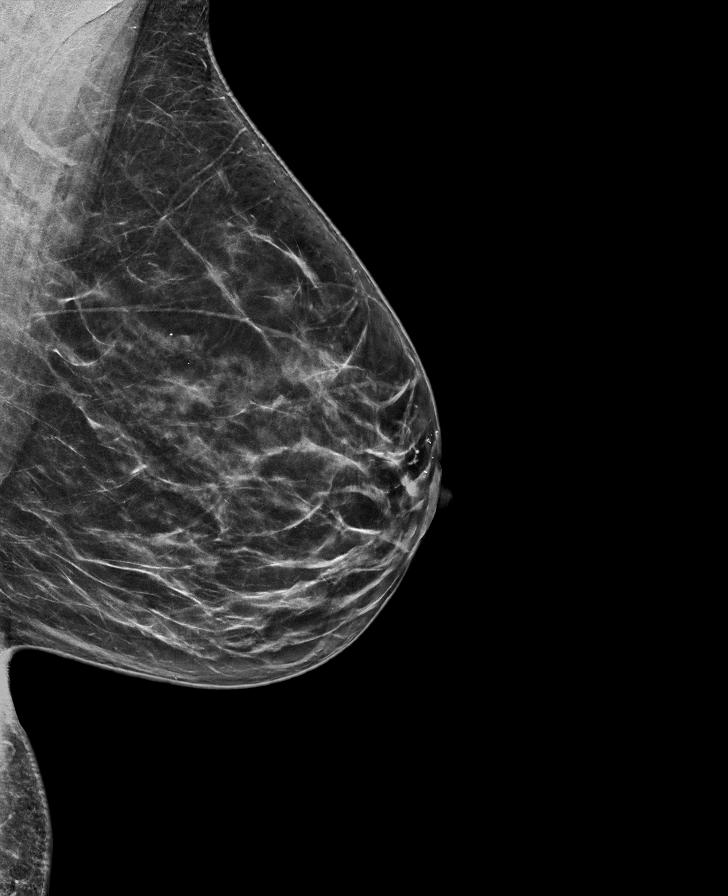

[R CC synth-2D]
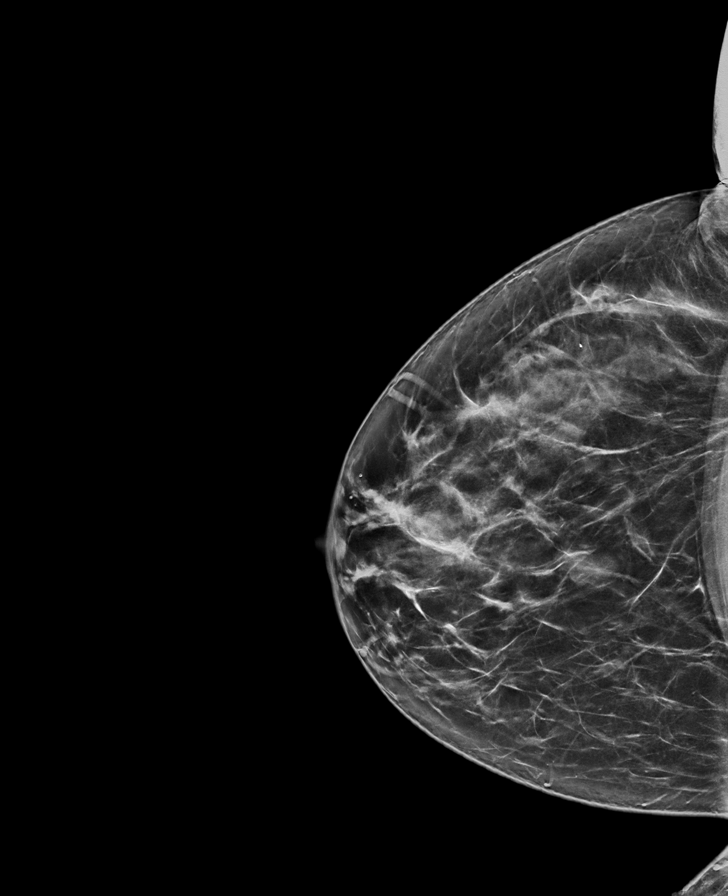

[R MLO synth-2D]
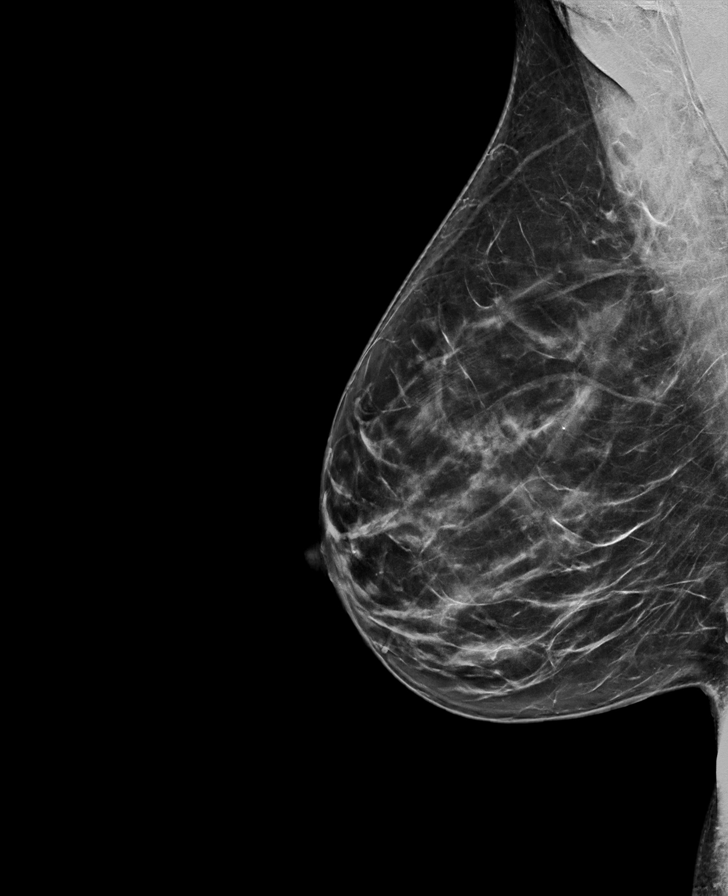

[L CC synth-2D]
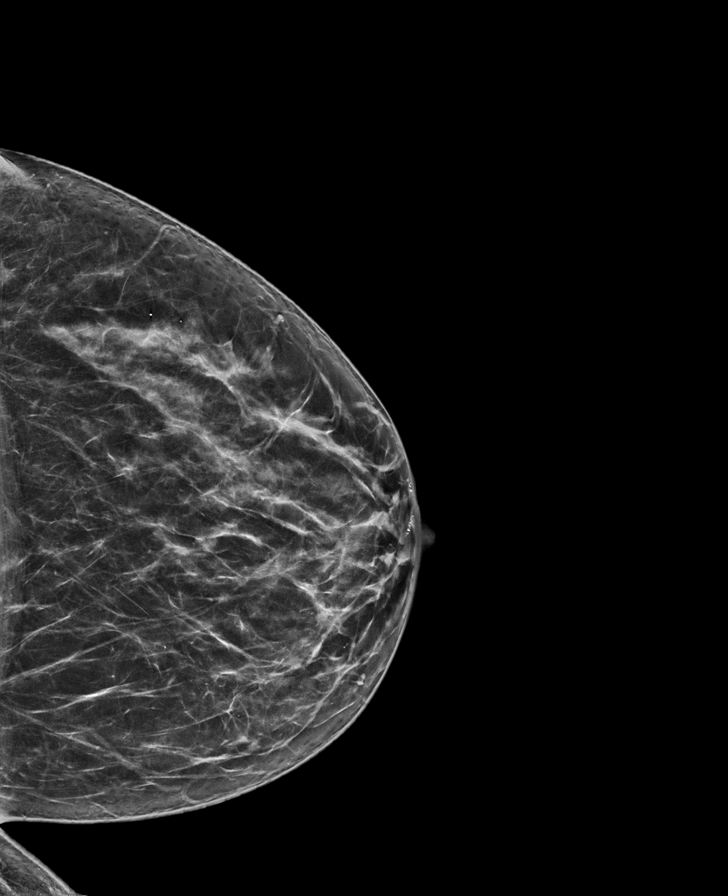

[R CC tomo · tomo slice 41/81.0]
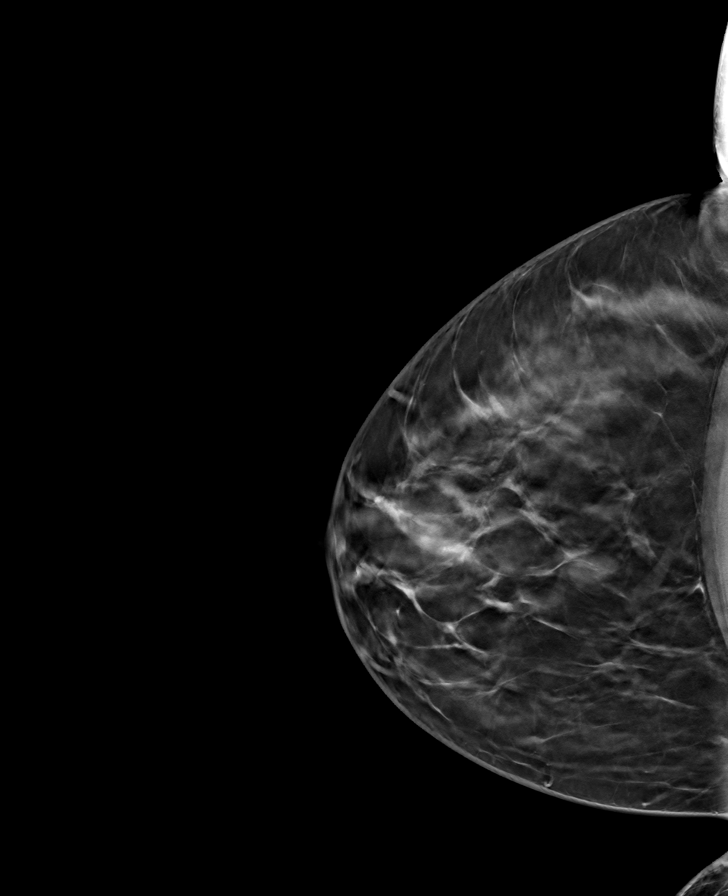

[L MLO tomo · tomo slice 40/79.0]
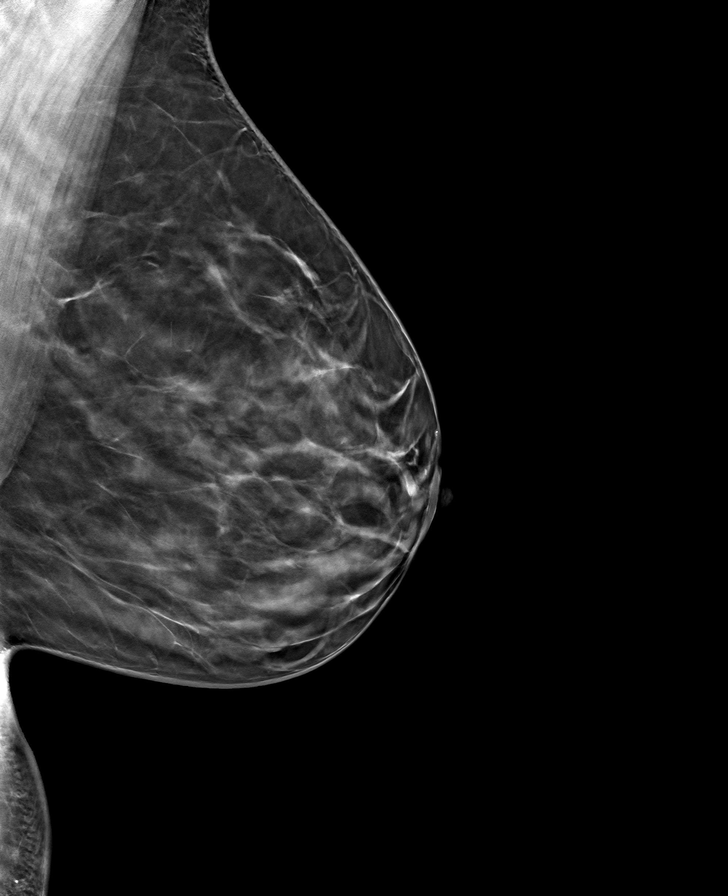

[R MLO tomo · tomo slice 43/85.0]
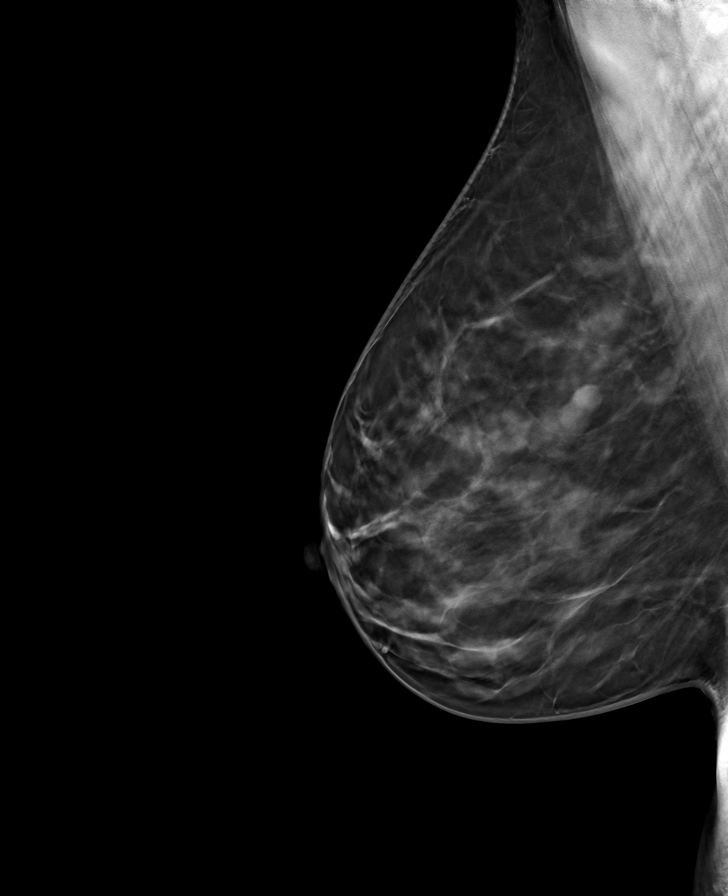

[L CC tomo · tomo slice 37/74.0]
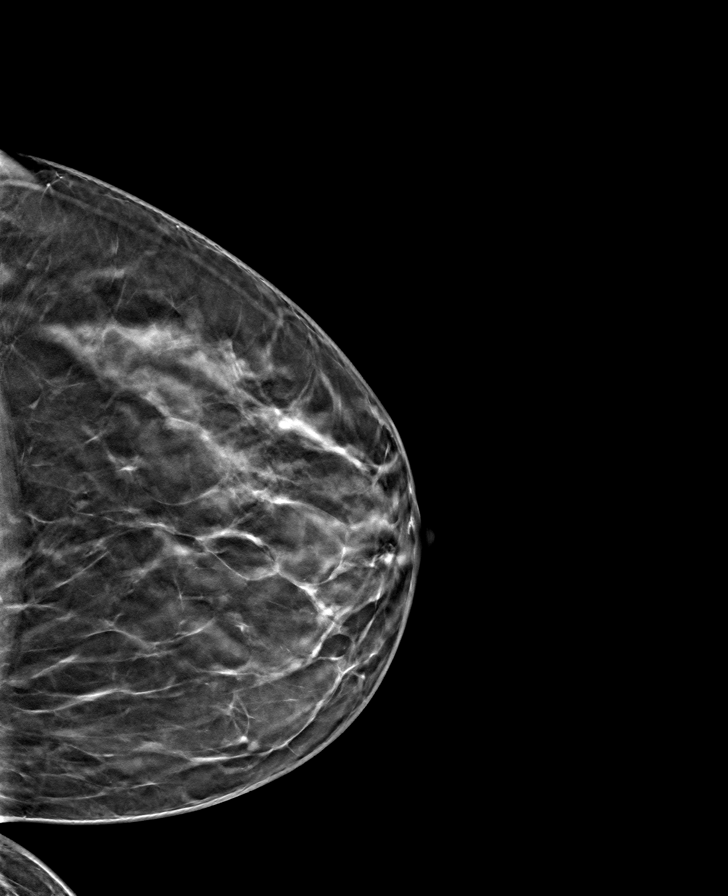

[8 of 24 positions shown; findings below may reference images not displayed]

ACR Breast Density Category c: The breast tissue is heterogeneously
dense, which may obscure small masses.
FINDINGS: In the right breast, a possible mass warrants further evaluation. In
the left breast, no findings suspicious for malignancy.
IMPRESSION: Further evaluation is suggested for possible mass in the right
breast.

RECOMMENDATION:
Ultrasound of the right breast. (Code:[id number])

The patient will be contacted regarding the findings, and additional
imaging will be scheduled.

BI-RADS CATEGORY  0: Incomplete. Need additional imaging evaluation
and/or prior mammograms for comparison.

## 2023-11-14 ENCOUNTER — Ambulatory Visit
Admission: EM | Admit: 2023-11-14 | Discharge: 2023-11-14 | Disposition: A | Discharge status: Home / Self Care | Payer: Medicaid Other | Attending: Emergency Medicine | Admitting: Emergency Medicine

## 2023-11-14 DIAGNOSIS — N898 Other specified noninflammatory disorders of vagina: Secondary | ICD-10-CM | POA: Diagnosis present

## 2023-11-14 NOTE — ED Triage Notes (Signed)
 Sx x 2-3 days  Vaginal odor and irritation.

## 2023-11-14 NOTE — ED Provider Notes (Signed)
 [facility]    CSN: [id number] Arrival date & time: 11/14/23  1629      History   Chief Complaint Chief Complaint  Patient presents with   Vaginal Itching    HPI Stacy Downs is a 43 y.o. female.   HPI  43 year old female with past medical history significant for scoliosis and lymphedema presents for evaluation of vaginal odor and irritation has been present for the last 2 to 3 days.  No associated discharge, burning with urination or urinary urgency or frequency.  Also no vaginal bleeding.  She denies any concern for STIs.  She reports that she has not had a menstrual cycle in quite a while due to having a Nexplanon in place and her menstrual cycle is irregular.  Past Medical History:  Diagnosis Date   Lymphedema    Scoliosis     Patient Active Problem List   Diagnosis Date Noted   Chronic venous insufficiency 06/13/2022   Lymphedema 06/13/2022    Past Surgical History:  Procedure Laterality Date   OOPHORECTOMY Left     OB History   No obstetric history on file.      Home Medications    Prior to Admission medications   Medication Sig Start Date End Date Taking? Authorizing Provider  etonogestrel (NEXPLANON) 68 MG IMPL implant 1 each by Subdermal route once.   Yes [provider]  LINZESS 72 MCG capsule Take 72 mcg by mouth daily. 03/14/22   [provider]  meloxicam (MOBIC) 7.5 MG tablet Take 7.5 mg by mouth daily. 03/14/22   [provider]  methylPREDNISolone (MEDROL DOSEPAK) 4 MG TBPK tablet 6,5,4,3,2,1 05/04/23   Orvil Feil, PA-C  traZODone (DESYREL) 50 MG tablet Take 50 mg by mouth at bedtime. 03/14/22   [provider]    Family History Family History  Problem Relation Age of Onset   Hypertension Mother    Hyperlipidemia Mother    Other Father        unknown medical history   Breast cancer Neg Hx     Social History Social History   Tobacco Use   Smoking status: Former    Current  packs/day: 0.50    Types: Cigarettes   Smokeless tobacco: Never  Vaping Use   Vaping status: Never Used  Substance Use Topics   Alcohol use: Yes    Comment: occasional   Drug use: No     Allergies   Shrimp [shellfish allergy]   Review of Systems Review of Systems  Genitourinary:  Positive for vaginal pain. Negative for dysuria, frequency, hematuria, urgency, vaginal bleeding and vaginal discharge.     Physical Exam Triage Vital Signs ED Triage Vitals  Encounter Vitals Group     BP 11/14/23 1655 114/62     Systolic BP Percentile --      Diastolic BP Percentile --      Pulse Rate 11/14/23 1655 71     Resp 11/14/23 1655 17     Temp 11/14/23 1655 98.3 F (36.8 C)     Temp Source 11/14/23 1655 Oral     SpO2 11/14/23 1655 100 %     Weight --      Height --      Head Circumference --      Peak Flow --      Pain Score 11/14/23 1654 0     Pain Loc --      Pain Education --      Exclude from [employer]  Chart --    No data found.  Updated Vital Signs BP 114/62 (BP Location: Right Arm)   Pulse 71   Temp 98.3 F (36.8 C) (Oral)   Resp 17   SpO2 100%   Visual Acuity Right Eye Distance:   Left Eye Distance:   Bilateral Distance:    Right Eye Near:   Left Eye Near:    Bilateral Near:     Physical Exam Vitals and nursing note reviewed.  Constitutional:      Appearance: Normal appearance. She is not diaphoretic.  HENT:     Head: Normocephalic and atraumatic.  Skin:    General: Skin is warm and dry.     Capillary Refill: Capillary refill takes less than 2 seconds.  Neurological:     General: No focal deficit present.     Mental Status: She is alert and oriented to person, place, and time.      UC Treatments / Results  Labs (all labs ordered are listed, but only abnormal results are displayed) Labs Reviewed  CERVICOVAGINAL ANCILLARY ONLY    EKG   Radiology No results found.  Procedures Procedures (including critical care time)  Medications  Ordered in UC Medications - No data to display  Initial Impression / Assessment and Plan / UC Course  I have reviewed the triage vital signs and the nursing notes.  Pertinent labs & imaging results that were available during my care of the patient were reviewed by me and considered in my medical decision making (see chart for details).   Patient is a pleasant, nontoxic-appearing 43 year old female presenting for evaluation of 2 to 3 days worth of vaginal irritation and vaginal odor without associated discharge or urinary symptoms.  She denies any concerns for STIs.  She reports that she has not had a menstrual cycle in quite a while due to having the Nexplanon in place.  Patient does have an OB/GYN.  She denies any concern for sexually transmitted infections.  I will order a cervical vaginal cytology swab to evaluate the presence of GC, NG, trichomoniasis, BV, or yeast.  If she test positive for any infection she will be contacted by phone.  If her results are negative they will appear in her MyChart.   Final Clinical Impressions(s) / UC Diagnoses   Final diagnoses:  Vaginal irritation     Discharge Instructions      As we discussed, your vaginal irritation may be result of bacterial vaginosis or vaginal yeast infection.  The swab you collected today will go to the lab and we will test you for both those infections as well as gonorrhea, chlamydia, and trichomonas.  If you test positive for any infection you will be contacted by phone and treatment options will be made available.  If your results are negative they will appear in your MyChart.  If your results are negative I recommend that you follow-up with your OB/GYN for further evaluation.     ED Prescriptions   None    PDMP not reviewed this encounter.   Becky Augusta, NP 11/14/23 [phone]

## 2023-11-14 NOTE — Discharge Instructions (Signed)
 As we discussed, your vaginal irritation may be result of bacterial vaginosis or vaginal yeast infection.  The swab you collected today will go to the lab and we will test you for both those infections as well as gonorrhea, chlamydia, and trichomonas.  If you test positive for any infection you will be contacted by phone and treatment options will be made available.  If your results are negative they will appear in your MyChart.  If your results are negative I recommend that you follow-up with your OB/GYN for further evaluation.

## 2023-11-15 ENCOUNTER — Telehealth (HOSPITAL_COMMUNITY): Payer: Self-pay

## 2023-11-15 ENCOUNTER — Emergency Department
Admission: EM | Admit: 2023-11-15 | Discharge: 2023-11-15 | Disposition: A | Discharge status: Home / Self Care | Payer: Medicaid Other | Attending: Emergency Medicine | Admitting: Emergency Medicine

## 2023-11-15 ENCOUNTER — Other Ambulatory Visit: Payer: Self-pay

## 2023-11-15 ENCOUNTER — Emergency Department: Payer: Medicaid Other

## 2023-11-15 DIAGNOSIS — M545 Low back pain, unspecified: Secondary | ICD-10-CM | POA: Diagnosis present

## 2023-11-15 LAB — CERVICOVAGINAL ANCILLARY ONLY
Bacterial Vaginitis (gardnerella): POSITIVE — AB
Candida Glabrata: NEGATIVE
Candida Vaginitis: NEGATIVE
Chlamydia: NEGATIVE
Comment: NEGATIVE
Comment: NEGATIVE
Comment: NEGATIVE
Comment: NEGATIVE
Comment: NEGATIVE
Comment: NORMAL
Neisseria Gonorrhea: NEGATIVE
Trichomonas: NEGATIVE

## 2023-11-15 MED ORDER — MELOXICAM 15 MG PO TABS: 15.0000 mg | ORAL_TABLET | Freq: Every day | ORAL | 0 refills | Status: AC

## 2023-11-15 MED ORDER — METRONIDAZOLE 500 MG PO TABS: 500.0000 mg | ORAL_TABLET | Freq: Two times a day (BID) | ORAL | 0 refills | Status: AC

## 2023-11-15 MED ORDER — DEXAMETHASONE SODIUM PHOSPHATE 10 MG/ML IJ SOLN
10.0000 mg | Freq: Once | INTRAMUSCULAR | Status: AC
Start: 2023-11-15 — End: 2023-11-15
  Administered 2023-11-15: 10 mg via INTRAMUSCULAR
  Filled 2023-11-15: qty 1

## 2023-11-15 MED ORDER — KETOROLAC TROMETHAMINE 15 MG/ML IJ SOLN
15.0000 mg | Freq: Once | INTRAMUSCULAR | Status: AC
  Administered 2023-11-15: 15 mg via INTRAMUSCULAR
  Filled 2023-11-15: qty 1

## 2023-11-15 MED ORDER — METHOCARBAMOL 500 MG PO TABS: 1000.0000 mg | ORAL_TABLET | Freq: Three times a day (TID) | ORAL | 0 refills | Status: AC

## 2023-11-15 NOTE — Discharge Instructions (Addendum)
 The xrays of your lower back were normal and did not show any fractures but does show some degenerative changes.  I believe you have strained muscles in your back and this is the cause of your back pain. I have attached information for orthopedics who you can schedule a follow up appointment with if you continue to have pain.  Please take the meloxicam (Mobic) once a day for 2 weeks.  This is an anti-inflammatory.  Do not take other NSAIDs while taking this medication.  NSAIDs include ibuprofen, Motrin, Advil, naproxen, Aleve, celecoxib, and Celebrex.  You can take 1000 mg of methocarbamol every 8 hours as needed for muscle spasms.  This is a muscle relaxer.  It can be sedating so do not drive after taking it.  You can take 650 mg of Tylenol every 6 hours as needed for pain.  Please continue to use topical pain relievers like ice, heat and muscle creams.  Return to the emergency department with worsening symptoms like numbness in the genital area, bladder or bowel incontinence, fever with back pain, weakness or numbness in your legs.  I have also sent an antibiotic called metronidazole for treatment of bacterial vaginosis. Please take it every 12 hours for 7 days. Do not drink alcohol while taking this medication.

## 2023-11-15 NOTE — ED Provider Notes (Signed)
 [facility] Provider Note    Event Date/Time   First MD Initiated Contact with Patient 11/15/23 1727     (approximate)   History   Back Pain   HPI  Stacy Downs is a 43 y.o. female with PMH of scoliosis and lymphedema who presents for evaluation of lower back pain for several days.  Patient states that she has had back pain like this before.  She says she has tried multiple things at home to resolve her pain but feels that is getting worse.  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  Patient denies fevers, urinary symptoms, saddle anesthesia, changes in bladder and bowel function, radiation of pain to the legs, numbness/weakness/tingling in the legs.  No trauma or recent injury.      Physical Exam   Triage Vital Signs: ED Triage Vitals  Encounter Vitals Group     BP 11/15/23 1620 116/78     Systolic BP Percentile --      Diastolic BP Percentile --      Pulse Rate 11/15/23 1620 76     Resp 11/15/23 1620 17     Temp 11/15/23 1620 98.1 F (36.7 C)     Temp Source 11/15/23 1620 Oral     SpO2 11/15/23 1620 100 %     Weight 11/15/23 1619 188 lb (85.3 kg)     Height 11/15/23 1619 5\' 6"  (1.676 m)     Head Circumference --      Peak Flow --      Pain Score 11/15/23 1619 8     Pain Loc --      Pain Education --      Exclude from Growth Chart --     Most recent vital signs: Vitals:   11/15/23 1620  BP: 116/78  Pulse: 76  Resp: 17  Temp: 98.1 F (36.7 C)  SpO2: 100%   General: Awake, no distress.  CV:  Good peripheral perfusion.  RRR. Resp:  Normal effort.  CTAB. Abd:  No distention.  Other:  No tenderness to palpation over the vertebral spines, mild tenderness to palpation over the paraspinal muscles of the left lower back, distal sensation in bilateral lower extremities maintained, dorsalis pedis pulses are 2+ and regular, 5/5 strength in bilateral  lower extremities.  Patellar tendon reflex 2+ bilaterally.  Walks, toe walks and heel walks without difficulty.   ED Results / Procedures / Treatments   Labs (all labs ordered are listed, but only abnormal results are displayed) Labs Reviewed - No data to display   RADIOLOGY  Lumbar spine x-ray obtained, I interpreted the images as well as reviewed the radiologist report which is negative for any acute abnormalities but does show some degenerative changes.  PROCEDURES:  Critical Care performed: No  Procedures   MEDICATIONS ORDERED IN ED: Medications  ketorolac (TORADOL) 15 MG/ML injection 15 mg (has no administration in time range)  dexamethasone (DECADRON) injection 10 mg (has no administration in time range)     IMPRESSION / MDM / ASSESSMENT AND PLAN / ED COURSE  I reviewed the triage vital signs and the nursing notes.                             43 year old female presents for evaluation of lower back pain.  Vital signs are stable,  patient NAD on exam.  Differential diagnosis includes, but is not limited to, musculoskeletal strain, disc herniation, spinal stenosis, lumbar radiculopathy, osteoarthritis, cauda equina, osteomyelitis, epidural abscess, aortic aneurysm, aortic dissection.   Patient's presentation is most consistent with acute complicated illness / injury requiring diagnostic workup.  Lumbar x-ray is negative for acute abnormalities.  Physical exam is reassuring.  Patient does not report any red flag signs or symptoms.  I believe her pain is musculoskeletal in origin as a result of overuse from work.  Patient will be given Toradol and dexamethasone while in the emergency department.  I will send her with a prescription for meloxicam and a muscle relaxer.  We discussed using topical pain relievers including ice, heat and muscle creams.    Urinalysis and pregnancy test not obtained as patient denies any symptoms at this time. Patient was seen in urgent care two  days ago for vaginal irritation and tested for gonorrhea, chlamydia, BV, yeast and trichomonas. She was not yet informed of her test results which I am able to see. She tested positive for bacterial vaginosis so I will send metronidazole for this.  Patient voiced understanding, all questions were answered and she was stable at discharge.    FINAL CLINICAL IMPRESSION(S) / ED DIAGNOSES   Final diagnoses:  Acute left-sided low back pain without sciatica     Rx / DC Orders   ED Discharge Orders          Ordered    meloxicam (MOBIC) 15 MG tablet  Daily        11/15/23 1829    methocarbamol (ROBAXIN) 500 MG tablet  3 times daily        11/15/23 1829    metroNIDAZOLE (FLAGYL) 500 MG tablet  2 times daily        11/15/23 1842             Note:  This document was prepared using Dragon voice recognition software and may include unintentional dictation errors.   Cameron Ali, PA-C 11/15/23 Fontaine No, MD 11/15/23 2127

## 2023-11-15 NOTE — ED Triage Notes (Signed)
 Pt sts that she has been having back pain for several days. 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

## 2023-11-19 ENCOUNTER — Telehealth (HOSPITAL_COMMUNITY): Payer: Self-pay

## 2023-11-19 NOTE — Telephone Encounter (Signed)
 Created in error

## 2023-11-25 ENCOUNTER — Other Ambulatory Visit: Payer: Self-pay

## 2023-11-25 ENCOUNTER — Emergency Department
Admission: EM | Admit: 2023-11-25 | Discharge: 2023-11-25 | Disposition: A | Discharge status: Home / Self Care | Attending: Student in an Organized Health Care Education/Training Program | Admitting: Student in an Organized Health Care Education/Training Program

## 2023-11-25 DIAGNOSIS — R310 Gross hematuria: Secondary | ICD-10-CM | POA: Diagnosis not present

## 2023-11-25 DIAGNOSIS — R319 Hematuria, unspecified: Secondary | ICD-10-CM | POA: Diagnosis present

## 2023-11-25 LAB — URINALYSIS, ROUTINE W REFLEX MICROSCOPIC
Bacteria, UA: NONE SEEN
Bilirubin Urine: NEGATIVE
Glucose, UA: NEGATIVE mg/dL
Ketones, ur: NEGATIVE mg/dL
Nitrite: NEGATIVE
Protein, ur: NEGATIVE mg/dL
Specific Gravity, Urine: 1.026 (ref 1.005–1.030)
pH: 5 (ref 5.0–8.0)

## 2023-11-25 LAB — PREGNANCY, URINE: Preg Test, Ur: NEGATIVE

## 2023-11-25 NOTE — ED Provider Notes (Signed)
 [facility] Provider Note    Event Date/Time   First MD Initiated Contact with Patient 11/25/23 0805     (approximate)   History   Urinary issue   HPI   Stacy Downs is a 43 y.o. female with PMH of lymphedema and scoliosis who presents for evaluation of dark-colored urine this morning.  Patient states that she noticed some dark blood in her urine the first time she peed this morning.  She has a picture of it and it appears to be brownish sedimentation in the bottom of the toilet bowl with yellow urine.  She denies urinary symptoms including frequency, urgency and dysuria.  No abnormal vaginal discharge.  No pelvic pain or back pain.  No fevers.  Patient is sexually active but was recently tested for STDs and has not had a new partner since then.  She reports irregular.'s due to having a Nexplanon.      Physical Exam   Triage Vital Signs: ED Triage Vitals [11/25/23 0754]  Encounter Vitals Group     BP 119/65     Systolic BP Percentile      Diastolic BP Percentile      Pulse Rate 70     Resp 19     Temp 97.8 F (36.6 C)     Temp src      SpO2 100 %     Weight 180 lb (81.6 kg)     Height 5\' 6"  (1.676 m)     Head Circumference      Peak Flow      Pain Score 0     Pain Loc      Pain Education      Exclude from Growth Chart     Most recent vital signs: Vitals:   11/25/23 0754  BP: 119/65  Pulse: 70  Resp: 19  Temp: 97.8 F (36.6 C)  SpO2: 100%   General: Awake, no distress.  CV:  Good peripheral perfusion.  Resp:  Normal effort.  Abd:  No distention. No CVA tenderness. Other:     ED Results / Procedures / Treatments   Labs (all labs ordered are listed, but only abnormal results are displayed) Labs Reviewed  URINALYSIS, ROUTINE W REFLEX MICROSCOPIC - Abnormal; Notable for the following components:      Result Value   Color, Urine YELLOW (*)    APPearance CLEAR (*)    Hgb urine dipstick MODERATE (*)    Leukocytes,Ua TRACE (*)     All other components within normal limits  PREGNANCY, URINE    PROCEDURES:  Critical Care performed: No  Procedures   MEDICATIONS ORDERED IN ED: Medications - No data to display   IMPRESSION / MDM / ASSESSMENT AND PLAN / ED COURSE  I reviewed the triage vital signs and the nursing notes.                             43 year old female presents for evaluation of dark-colored urine.  Vital signs are stable and patient NAD on exam.  Differential diagnosis includes, but is not limited to, UTI, STD, nephrolithiasis, glomerulonephritis, menstruation.  Patient's presentation is most consistent with acute complicated illness / injury requiring diagnostic workup.  Urinalysis shows moderate hemoglobin confirming blood in the urine with some trace leukocytes but is otherwise unremarkable.  Physical exam is reassuring as patient does not have CVA tenderness. Believe this may be related to her menstrual cycle.  Given her lack of other symptoms and that this has only occurred 1 time, I do not feel that further emergent workup is needed.  Recommend that she keep an eye on this and we will give her information for urology to follow-up with should this recur.  Reviewed return precautions.  Voiced understanding, all questions were answered and she was stable at discharge.    FINAL CLINICAL IMPRESSION(S) / ED DIAGNOSES   Final diagnoses:  Gross hematuria     Rx / DC Orders   ED Discharge Orders     None        Note:  This document was prepared using Dragon voice recognition software and may include unintentional dictation errors.   Cameron Ali, PA-C 11/25/23 9811    Willy Eddy, MD 11/25/23 1043

## 2023-11-25 NOTE — Discharge Instructions (Addendum)
 Your urinalysis showed that there is some blood in your urine today but is otherwise normal.  There were no signs of infection.  If this continues please schedule a follow-up appointment with urology whose information is attached.

## 2023-11-25 NOTE — ED Triage Notes (Signed)
 Pt comes with c/o dark colored urine this morning when she woke up. Pt states it looked like a little blood in urine. Pt states she has been getting cramps but has irregular period.   Pt states she has been taking some back pain med and not sure if that has anything to do with it. Pt denies any burning, itching or pain.

## 2023-12-10 ENCOUNTER — Other Ambulatory Visit: Payer: Self-pay | Admitting: Gerontology

## 2023-12-10 DIAGNOSIS — Z1231 Encounter for screening mammogram for malignant neoplasm of breast: Secondary | ICD-10-CM

## 2023-12-17 ENCOUNTER — Other Ambulatory Visit: Payer: Self-pay

## 2023-12-17 ENCOUNTER — Emergency Department
Admission: EM | Admit: 2023-12-17 | Discharge: 2023-12-17 | Disposition: A | Discharge status: Home / Self Care | Attending: Emergency Medicine | Admitting: Emergency Medicine

## 2023-12-17 DIAGNOSIS — M545 Low back pain, unspecified: Secondary | ICD-10-CM | POA: Diagnosis present

## 2023-12-17 DIAGNOSIS — M549 Dorsalgia, unspecified: Secondary | ICD-10-CM

## 2023-12-17 DIAGNOSIS — R519 Headache, unspecified: Secondary | ICD-10-CM | POA: Insufficient documentation

## 2023-12-17 DIAGNOSIS — Y92410 Unspecified street and highway as the place of occurrence of the external cause: Secondary | ICD-10-CM | POA: Diagnosis not present

## 2023-12-17 DIAGNOSIS — M542 Cervicalgia: Secondary | ICD-10-CM | POA: Insufficient documentation

## 2023-12-17 MED ORDER — IBUPROFEN 800 MG PO TABS
800.0000 mg | ORAL_TABLET | Freq: Once | ORAL | Status: AC
  Administered 2023-12-17: 800 mg via ORAL
  Filled 2023-12-17: qty 1

## 2023-12-17 MED ORDER — METHOCARBAMOL 500 MG PO TABS: 1000.0000 mg | ORAL_TABLET | Freq: Three times a day (TID) | ORAL | 0 refills | Status: AC
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
## 2023-12-17 NOTE — Discharge Instructions (Addendum)
 Please take 800 mg of ibupropfen every 8 hours and 650 mg of tylenol every 6 hours as needed for pain. You can also use ice, heat and topical pain relievers.   The muscle relaxer can be taken every 8 hours needed for muscle spasms, this medication is sedating, so do not drive after taking it.   Return to the ED if you develop chest pain, shortness of breath, abdominal pain or new bruising. The pain should improve with time, if it is not getting better, please be seen by your primary care provider.
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
## 2024-01-05 ENCOUNTER — Other Ambulatory Visit: Payer: Self-pay

## 2024-01-05 ENCOUNTER — Emergency Department
Admission: EM | Admit: 2024-01-05 | Discharge: 2024-01-05 | Disposition: A | Discharge status: Home / Self Care | Attending: Emergency Medicine | Admitting: Emergency Medicine

## 2024-01-05 DIAGNOSIS — M545 Low back pain, unspecified: Secondary | ICD-10-CM | POA: Diagnosis present

## 2024-01-05 DIAGNOSIS — N898 Other specified noninflammatory disorders of vagina: Secondary | ICD-10-CM | POA: Diagnosis not present

## 2024-01-05 DIAGNOSIS — G8929 Other chronic pain: Secondary | ICD-10-CM | POA: Insufficient documentation

## 2024-01-05 LAB — URINALYSIS, ROUTINE W REFLEX MICROSCOPIC: RBC / HPF: >50 RBC/hpf (ref 0–5)

## 2024-01-05 LAB — POC URINE PREG, ED: Preg Test, Ur: NEGATIVE

## 2024-01-05 MED ORDER — LIDOCAINE 5 % EX PTCH
1.0000 | MEDICATED_PATCH | CUTANEOUS | Status: DC
  Administered 2024-01-05: 1 via TRANSDERMAL
  Filled 2024-01-05: qty 1

## 2024-01-05 MED ORDER — KETOROLAC TROMETHAMINE 30 MG/ML IJ SOLN
30.0000 mg | Freq: Once | INTRAMUSCULAR | Status: AC
Start: 2024-01-05 — End: 2024-01-05
  Administered 2024-01-05: 30 mg via INTRAMUSCULAR
  Filled 2024-01-05: qty 1

## 2024-01-05 MED ORDER — CYCLOBENZAPRINE HCL 5 MG PO TABS: 5.0000 mg | ORAL_TABLET | Freq: Three times a day (TID) | ORAL | 0 refills | Status: DC | PRN

## 2024-01-05 MED ORDER — DEXAMETHASONE SODIUM PHOSPHATE 10 MG/ML IJ SOLN
10.0000 mg | Freq: Once | INTRAMUSCULAR | Status: AC
  Administered 2024-01-05: 10 mg via INTRAMUSCULAR
  Filled 2024-01-05: qty 1

## 2024-01-05 MED ORDER — HYDROCODONE-ACETAMINOPHEN 5-325 MG PO TABS: 1.0000 | ORAL_TABLET | ORAL | 0 refills | Status: DC | PRN

## 2024-01-05 NOTE — Discharge Instructions (Addendum)
 Please follow-up with your primary care provider in 1 week for medication management.
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
## 2024-01-05 NOTE — ED Triage Notes (Signed)
 Pt to ED via POV from home. Pt reports bilateral lower back that has been present since [facility] on 3/30. Pt reports pain went away and is now back. No radiation to legs. Pt reports some irritation with urination.

## 2024-01-05 NOTE — ED Provider Notes (Signed)
 [facility] Emergency Department Provider Note     Event Date/Time   First MD Initiated Contact with Patient 01/05/24 1321     (approximate)   History   Back Pain   HPI  Stacy Downs is a 43 y.o. female with a history of scoliosis presents to the ED for evaluation of acute on chronic lower bilateral back pain with no radiation x today.  Denies injury or trauma.  No fever.  Denies loss of bowel or bladder control and saddle anesthesia.  Patient reports she noticed some vaginal irritation x 3 days but declines dysuria.  She endorses she just started her menstrual cycle.  Patient has no concerns with STD/STI at this time.    Physical Exam   Triage Vital Signs: ED Triage Vitals [01/05/24 1247]  Encounter Vitals Group     BP 127/82     Systolic BP Percentile      Diastolic BP Percentile      Pulse Rate 72     Resp 20     Temp 98.3 F (36.8 C)     Temp Source Oral     SpO2 98 %     Weight      Height      Head Circumference      Peak Flow      Pain Score 10     Pain Loc      Pain Education      Exclude from Growth Chart     Most recent vital signs: Vitals:   01/05/24 1247  BP: 127/82  Pulse: 72  Resp: 20  Temp: 98.3 F (36.8 C)  SpO2: 98%    General Awake, no distress.  HEENT NCAT. PERRL. EOMI.  CV:  Good peripheral perfusion.  RESP:  Normal effort.  ABD:  No distention.  Other:  Mild bilateral lower back tenderness.  No midline tenderness or deformity noted.    ED Results / Procedures / Treatments   Labs (all labs ordered are listed, but only abnormal results are displayed) Labs Reviewed  URINALYSIS, ROUTINE W REFLEX MICROSCOPIC - Abnormal; Notable for the following components:      Result Value   Color, Urine RED (*)    APPearance CLOUDY (*)    Glucose, UA   (*)    Value: TEST NOT REPORTED DUE TO COLOR INTERFERENCE OF URINE PIGMENT   Hgb urine dipstick   (*)    Value: TEST NOT REPORTED DUE TO COLOR INTERFERENCE OF  URINE PIGMENT   Bilirubin Urine   (*)    Value: TEST NOT REPORTED DUE TO COLOR INTERFERENCE OF URINE PIGMENT   Ketones, ur   (*)    Value: TEST NOT REPORTED DUE TO COLOR INTERFERENCE OF URINE PIGMENT   Protein, ur   (*)    Value: TEST NOT REPORTED DUE TO COLOR INTERFERENCE OF URINE PIGMENT   Nitrite   (*)    Value: TEST NOT REPORTED DUE TO COLOR INTERFERENCE OF URINE PIGMENT   Leukocytes,Ua   (*)    Value: TEST NOT REPORTED DUE TO COLOR INTERFERENCE OF URINE PIGMENT   Bacteria, UA RARE (*)    All other components within normal limits  URINE CULTURE  POC URINE PREG, ED   No results found.  PROCEDURES:  Critical Care performed: No  Procedures   MEDICATIONS ORDERED IN ED: Medications  lidocaine  (LIDODERM ) 5 % 1 patch (1 patch Transdermal Patch Applied 01/05/24 1404)  ketorolac  (TORADOL ) 30 MG/ML injection  30 mg (30 mg Intramuscular Given 01/05/24 1404)  dexamethasone  (DECADRON ) injection 10 mg (10 mg Intramuscular Given 01/05/24 1402)     IMPRESSION / MDM / ASSESSMENT AND PLAN / ED COURSE  I reviewed the triage vital signs and the nursing notes.                              Clinical Course as of 01/05/24 1938  Sat Jan 05, 2024  1408 Patient states she is currently on her menstrual cycle. [MH]    Clinical Course User Index [MH] Billye Buerger, PA-C    43 y.o. female presents to the emergency department for evaluation and treatment of acute on chronic low back pain without radiation. See HPI for further details.   Differential diagnosis includes, but is not limited to strain, chronic syndrome, UTI, cauda equina less likely  Patient's presentation is most consistent with acute complicated illness / injury requiring diagnostic workup.  Patient is alert and oriented she is hemodynamically stable.  Physical exam findings are overall reassuring as stated above.  No red flag signs.  No indication for imaging.  Patient stable condition for outpatient management.  Urinalysis not  reported due to menstrual cycle.  Urine culture sent.  Encouraged to follow-up with her primary care.  ED return precautions discussed.  FINAL CLINICAL IMPRESSION(S) / ED DIAGNOSES   Final diagnoses:  Chronic bilateral low back pain without sciatica    Rx / DC Orders   ED Discharge Orders          Ordered    HYDROcodone -acetaminophen  (NORCO/VICODIN) 5-325 MG tablet  Every 4 hours PRN        01/05/24 1515    cyclobenzaprine  (FLEXERIL ) 5 MG tablet  3 times daily PRN        01/05/24 1515             Note:  This document was prepared using Dragon voice recognition software and may include unintentional dictation errors.    Phyllis Breeze, Jerian Morais A, PA-C 01/05/24 Nydia Belfast    Claria Crofts, MD 01/06/24 [phone]

## 2024-01-06 LAB — URINE CULTURE: Culture: NO GROWTH

## 2024-03-20 ENCOUNTER — Ambulatory Visit (INDEPENDENT_AMBULATORY_CARE_PROVIDER_SITE_OTHER)

## 2024-03-20 ENCOUNTER — Ambulatory Visit
Admission: EM | Admit: 2024-03-20 | Discharge: 2024-03-20 | Disposition: A | Discharge status: Home / Self Care | Attending: Emergency Medicine | Admitting: Emergency Medicine

## 2024-03-20 DIAGNOSIS — R1084 Generalized abdominal pain: Secondary | ICD-10-CM

## 2024-03-20 DIAGNOSIS — K5900 Constipation, unspecified: Secondary | ICD-10-CM | POA: Insufficient documentation

## 2024-03-20 LAB — URINALYSIS, W/ REFLEX TO CULTURE (INFECTION SUSPECTED)
Bilirubin Urine: NEGATIVE
Glucose, UA: NEGATIVE mg/dL
Ketones, ur: NEGATIVE mg/dL
Leukocytes,Ua: NEGATIVE
Nitrite: NEGATIVE
Protein, ur: NEGATIVE mg/dL
Specific Gravity, Urine: >1.03 — ABNORMAL HIGH (ref 1.005–1.030)
pH: 6 (ref 5.0–8.0)

## 2024-03-20 LAB — PREGNANCY, URINE: Preg Test, Ur: NEGATIVE

## 2024-03-20 NOTE — ED Provider Notes (Signed)
 [facility]    CSN: [id number] Arrival date & time: 03/20/24  1548      History   Chief Complaint Chief Complaint  Patient presents with   Abdominal Pain    HPI Stacy Downs is a 43 y.o. female.   HPI  43 year old female with past medical history significant for scoliosis, lymphedema, and chronic venous insufficiency presents for evaluation of 2 days worth of abdominal swelling with gas and back pain.  She denies any fever, nausea, vomiting, or diarrhea.  She reports that she has been dealing with some constipation.  She is unsure of when her last normal bowel movement was.  She reports that she had a small hard bowel movement yesterday.  She denies any UTI symptoms.  She also denies any vaginal discharge.  Patient denies any changes to appetite.   Past Medical History:  Diagnosis Date   Lymphedema    Scoliosis     Patient Active Problem List   Diagnosis Date Noted   Chronic venous insufficiency 06/13/2022   Lymphedema 06/13/2022    Past Surgical History:  Procedure Laterality Date   OOPHORECTOMY Left     OB History   No obstetric history on file.      Home Medications    Prior to Admission medications   Medication Sig Start Date End Date Taking? Authorizing Provider  etonogestrel (NEXPLANON) 68 MG IMPL implant 1 each by Subdermal route once.   Yes [provider]  traZODone (DESYREL) 50 MG tablet Take 50 mg by mouth at bedtime. 03/14/22  Yes [provider]  cyclobenzaprine  (FLEXERIL ) 5 MG tablet Take 1 tablet (5 mg total) by mouth 3 (three) times daily as needed. 01/05/24   Margrette, Myah A, PA-C  HYDROcodone -acetaminophen  (NORCO/VICODIN) 5-325 MG tablet Take 1 tablet by mouth every 4 (four) hours as needed. 01/05/24   Margrette, Myah A, PA-C  LINZESS 72 MCG capsule Take 72 mcg by mouth daily. 03/14/22   [provider]  methylPREDNISolone  (MEDROL  DOSEPAK) 4 MG TBPK tablet 6,5,4,3,2,1 05/04/23   Woods, Jaclyn M, PA-C     Family History Family History  Problem Relation Age of Onset   Hypertension Mother    Hyperlipidemia Mother    Other Father        unknown medical history   Breast cancer Neg Hx     Social History Social History   Tobacco Use   Smoking status: Former    Current packs/day: 0.50    Types: Cigarettes   Smokeless tobacco: Never  Vaping Use   Vaping status: Never Used  Substance Use Topics   Alcohol use: Yes    Comment: occasional   Drug use: No     Allergies   Shrimp [shellfish allergy]   Review of Systems Review of Systems  Constitutional:  Negative for fever.  Gastrointestinal:  Positive for abdominal distention, abdominal pain and constipation. Negative for diarrhea, nausea and vomiting.  Genitourinary:  Negative for dysuria, frequency, hematuria, urgency, vaginal discharge and vaginal pain.  Musculoskeletal:  Positive for back pain.     Physical Exam Triage Vital Signs ED Triage Vitals  Encounter Vitals Group     BP      Girls Systolic BP Percentile      Girls Diastolic BP Percentile      Boys Systolic BP Percentile      Boys Diastolic BP Percentile      Pulse      Resp      Temp  Temp src      SpO2      Weight      Height      Head Circumference      Peak Flow      Pain Score      Pain Loc      Pain Education      Exclude from Growth Chart    No data found.  Updated Vital Signs BP 113/70 (BP Location: Left Arm)   Pulse 66   Temp 98.5 F (36.9 C) (Oral)   Ht 5' 5 (1.651 m)   Wt 170 lb (77.1 kg)   LMP  (LMP Unknown) Comment: neg preg test  SpO2 99%   BMI 28.29 kg/m   Visual Acuity Right Eye Distance:   Left Eye Distance:   Bilateral Distance:    Right Eye Near:   Left Eye Near:    Bilateral Near:     Physical Exam Vitals and nursing note reviewed.  Constitutional:      Appearance: Normal appearance. She is not ill-appearing.  HENT:     Head: Normocephalic and atraumatic.  Cardiovascular:     Rate and Rhythm: Normal  rate and regular rhythm.     Pulses: Normal pulses.     Heart sounds: Normal heart sounds. No murmur heard.    No friction rub. No gallop.  Pulmonary:     Effort: Pulmonary effort is normal.     Breath sounds: Normal breath sounds. No wheezing, rhonchi or rales.  Abdominal:     General: There is distension.     Palpations: Abdomen is soft.     Tenderness: There is abdominal tenderness. There is no guarding or rebound.  Skin:    General: Skin is warm and dry.     Capillary Refill: Capillary refill takes less than 2 seconds.     Findings: No rash.  Neurological:     General: No focal deficit present.     Mental Status: She is alert and oriented to person, place, and time.      UC Treatments / Results  Labs (all labs ordered are listed, but only abnormal results are displayed) Labs Reviewed  URINALYSIS, W/ REFLEX TO CULTURE (INFECTION SUSPECTED) - Abnormal; Notable for the following components:      Result Value   Specific Gravity, Urine >1.030 (*)    Hgb urine dipstick TRACE (*)    Bacteria, UA FEW (*)    All other components within normal limits  PREGNANCY, URINE    EKG   Radiology DG Abd 2 Views Result Date: 03/20/2024 CLINICAL DATA:  [id number] Abdominal pain [id number] EXAM: ABDOMEN - 2 VIEW COMPARISON:  None Available. FINDINGS: Nonobstructive bowel gas pattern. No pneumoperitoneum. No organomegaly or radiopaque calculi. No acute fracture or destructive lesion. The lung bases are clear. IMPRESSION: Nonobstructive bowel gas pattern. Electronically Signed   By: Rogelia Myers M.D.   On: 03/20/2024 17:08    Procedures Procedures (including critical care time)  Medications Ordered in UC Medications - No data to display  Initial Impression / Assessment and Plan / UC Course  I have reviewed the triage vital signs and the nursing notes.  Pertinent labs & imaging results that were available during my care of the patient were reviewed by me and considered in my medical decision  making (see chart for details).   Patient is a nontoxic-appearing 43 year old female presenting for evaluation of 2 days worth of abdominal bloating and pain in the left upper quadrant of  her abdomen.  No associated UTI or genitourinary symptoms.  Also no nausea or vomiting.  She reports that she is continuing to eat and drink and has not had a change in appetite.  The patient has a Nexplanon in place but she is concerned about pregnancy because she has been in engaging in unprotected sex.  Since having the Nexplanon placed her menstrual cycles have been irregular and her last menstrual cycle was over a month ago.  Concerned about a possible return of an ovarian cyst as she had one previously on the left resulted in an oophorectomy.  And examined the patient is not in any acute distress though her abdomen is distended.  It is soft with some generalized tenderness but no focal finding.  I will order a urinalysis and urine pregnancy test to assess for the presence of pregnancy as well as a two-view abdomen to assess for possible constipation.  I have advised the patient that if her x-ray is negative I will most likely refer her to the emergency department for either pelvic ultrasound and/or an abdominal pelvic CT to better determine the cause of her abdominal pain and bloating.  Urinalysis shows a high specific gravity with trace hemoglobin but negative leukocyte esterase, nitrates, protein, or ketones.  Reflex microscopy is unremarkable save for few bacteria.  Urine pregnancy test is negative.  2 view abdomen independently reviewed and evaluated by me.  Impression: Patient has a significant stool burden throughout her colon with a nonobstructive bowel gas pattern.  Radiology overread is pending. Radiology impression states nonobstructive bowel gas pattern.  I will discharge patient with diagnosis constipation and have her use over-the-counter MiraLAX and Dulcolax to help resolve her constipation.  If she  has any increase in abdominal pain, nausea vomiting she cannot keep down fluids or medication, or centrally fever she should go to the ER for evaluation.   Final Clinical Impressions(s) / UC Diagnoses   Final diagnoses:  Generalized abdominal pain  Constipation, unspecified constipation type     Discharge Instructions      Increase your oral water intake so that you are remaining well-hydrated and there is increased water to help soften your bowel.  Take over-the-counter MiraLAX, 1 capful (17 g) in 8 ounces of a beverage of your choice daily until you are having normal bowel movements.  You can also take 2 over-the-counter Dulcolax tablets along with your first dose of MiraLAX to help move your stools along.  Increase your oral dietary fiber intake so as to add bulk to your stool and help decrease incidence of constipation.  If you have any increasing abdominal pain, blood in your stool, abdominal bloating, or fever please follow-up in the emergency department for evaluation.      ED Prescriptions   None    PDMP not reviewed this encounter.   Bernardino Ditch, NP 03/20/24 1719

## 2024-03-20 NOTE — Discharge Instructions (Signed)
 Increase your oral water intake so that you are remaining well-hydrated and there is increased water to help soften your bowel.  Take over-the-counter MiraLAX, 1 capful (17 g) in 8 ounces of a beverage of your choice daily until you are having normal bowel movements.  You can also take 2 over-the-counter Dulcolax tablets along with your first dose of MiraLAX to help move your stools along.  Increase your oral dietary fiber intake so as to add bulk to your stool and help decrease incidence of constipation.  If you have any increasing abdominal pain, blood in your stool, abdominal bloating, or fever please follow-up in the emergency department for evaluation.

## 2024-03-20 NOTE — ED Triage Notes (Signed)
 Pt c/o abdominal swelling x2days  Pt states that she has had gas and back pain  Pt denies vaginal discharge or odor, diarrhea, vomiting, nausea or urinary issues  Pt has a history of ovarian cysts.  Pt states that her stomach looks like its bulging and 2 days ago her stomach looked abnormal  Pt states that she gets a sharp and uncomfortable cramp when laying down

## 2024-04-27 ENCOUNTER — Other Ambulatory Visit: Payer: Self-pay

## 2024-04-27 ENCOUNTER — Emergency Department
Admission: EM | Admit: 2024-04-27 | Discharge: 2024-04-27 | Disposition: A | Discharge status: Home / Self Care | Attending: Emergency Medicine | Admitting: Emergency Medicine

## 2024-04-27 DIAGNOSIS — M545 Low back pain, unspecified: Secondary | ICD-10-CM | POA: Diagnosis present

## 2024-04-27 DIAGNOSIS — G8929 Other chronic pain: Secondary | ICD-10-CM | POA: Insufficient documentation

## 2024-04-27 MED ORDER — PREDNISONE 20 MG PO TABS: 40.0000 mg | ORAL_TABLET | Freq: Every day | ORAL | 0 refills | Status: AC

## 2024-04-27 MED ORDER — KETOROLAC TROMETHAMINE 30 MG/ML IJ SOLN
30.0000 mg | Freq: Once | INTRAMUSCULAR | Status: AC
  Administered 2024-04-27: 30 mg via INTRAMUSCULAR
  Filled 2024-04-27: qty 1

## 2024-04-27 MED ORDER — CYCLOBENZAPRINE HCL 5 MG PO TABS: 5.0000 mg | ORAL_TABLET | Freq: Three times a day (TID) | ORAL | 0 refills | Status: AC | PRN

## 2024-04-27 NOTE — ED Provider Notes (Signed)
 [facility] Emergency Department Provider Note     Event Date/Time   First MD Initiated Contact with Patient 04/27/24 2113     (approximate)   History   Back Pain   HPI  Stacy Downs is a 43 y.o. female with a history of lymphedema, scoliosis and chronic back pain, presents to the ED with acute on chronic low back pain.  She reports onset of symptoms this morning.  She denies any recent injury, trauma, falls.  No bladder or bowel incontinence reported.  Patient describes this pain as a flare of her typical back pain secondary to scoliosis.  She comes in presenting with typical pain requesting a Toradol  shot.  She reports she does not typically take medications for more than a few days following her visits, because typically the pain will stop at that point.   Physical Exam   Triage Vital Signs: ED Triage Vitals  Encounter Vitals Group     BP 04/27/24 2025 (!) 142/67     Girls Systolic BP Percentile --      Girls Diastolic BP Percentile --      Boys Systolic BP Percentile --      Boys Diastolic BP Percentile --      Pulse Rate 04/27/24 2025 66     Resp 04/27/24 2025 16     Temp 04/27/24 2025 98.3 F (36.8 C)     Temp Source 04/27/24 2025 Oral     SpO2 04/27/24 2025 98 %     Weight --      Height 04/27/24 2023 5' 6 (1.676 m)     Head Circumference --      Peak Flow --      Pain Score 04/27/24 2023 8     Pain Loc --      Pain Education --      Exclude from Growth Chart --     Most recent vital signs: Vitals:   04/27/24 2025 04/27/24 2229  BP: (!) 142/67 116/79  Pulse: 66 72  Resp: 16 20  Temp: 98.3 F (36.8 C)   SpO2: 98% 99%    General Awake, no distress. NAD HEENT NCAT. PERRL. EOMI. No rhinorrhea. Mucous membranes are moist.  CV:  Good peripheral perfusion.  RESP:  Normal effort.  ABD:  No distention.    ED Results / Procedures / Treatments   Labs (all labs ordered are listed, but only abnormal results are displayed) Labs  Reviewed - No data to display   EKG   RADIOLOGY  No results found.   PROCEDURES:  Critical Care performed: No  Procedures   MEDICATIONS ORDERED IN ED: Medications  ketorolac  (TORADOL ) 30 MG/ML injection 30 mg (30 mg Intramuscular Given 04/27/24 2228)     IMPRESSION / MDM / ASSESSMENT AND PLAN / ED COURSE  I reviewed the triage vital signs and the nursing notes.                              Differential diagnosis includes, but is not limited to, lumbar strain, lumbar radiculopathy, myalgias, acute on chronic pain  Patient's presentation is most consistent with acute, uncomplicated illness.  Patient's diagnosis is consistent with acute on chronic low back pain.  Patient with reassuring exam and workup, for her nontraumatic back pain.  Patient will be discharged home with prescriptions for cyclobenzaprine  and prednisone . Patient is to follow up with her primary provider as suggested,  as needed or otherwise directed. Patient is given ED precautions to return to the ED for any worsening or new symptoms.   FINAL CLINICAL IMPRESSION(S) / ED DIAGNOSES   Final diagnoses:  Chronic bilateral low back pain without sciatica     Rx / DC Orders   ED Discharge Orders          Ordered    cyclobenzaprine  (FLEXERIL ) 5 MG tablet  3 times daily PRN        04/27/24 2219    predniSONE  (DELTASONE ) 20 MG tablet  Daily with breakfast        04/27/24 2219             Note:  This document was prepared using Dragon voice recognition software and may include unintentional dictation errors.    Loyd Candida LULLA Aldona, PA-C 04/27/24 2344    Waymond Lorelle Cummins, MD 04/27/24 [phone]

## 2024-04-27 NOTE — Discharge Instructions (Addendum)
 Take the prescription meds as directed.  Follow-up with your primary provider for ongoing evaluation.  Return to ED if necessary.

## 2024-04-27 NOTE — ED Triage Notes (Signed)
 Pt to ed from home via POV for lower back pain that started this morning. Pt denies any falls. Pt has HX of scoliosis and thinks its just acting up. Pt denies any flank pain or urinary symptoms. Pt is caox4, in no acute distress and ambulatory in triage.

## 2024-04-27 NOTE — ED Notes (Signed)
 Questions and concerns addressed. Discharge teaching completed.   Prescriptions reviewed and pharmacy verified.   Pt ambulatory upon discharge.

## 2024-08-27 ENCOUNTER — Encounter: Payer: Self-pay | Admitting: Emergency Medicine

## 2024-08-27 ENCOUNTER — Other Ambulatory Visit: Payer: Self-pay

## 2024-08-27 ENCOUNTER — Emergency Department

## 2024-08-27 ENCOUNTER — Emergency Department
Admission: EM | Admit: 2024-08-27 | Discharge: 2024-08-27 | Disposition: A | Discharge status: Home / Self Care | Attending: Emergency Medicine | Admitting: Emergency Medicine

## 2024-08-27 DIAGNOSIS — M545 Low back pain, unspecified: Secondary | ICD-10-CM | POA: Diagnosis present

## 2024-08-27 LAB — POC URINE PREG, ED: Preg Test, Ur: NEGATIVE

## 2024-08-27 MED ORDER — LIDOCAINE 5 % EX PTCH
1.0000 | MEDICATED_PATCH | CUTANEOUS | Status: DC
  Filled 2024-08-27: qty 1

## 2024-08-27 MED ORDER — KETOROLAC TROMETHAMINE 15 MG/ML IJ SOLN
15.0000 mg | Freq: Once | INTRAMUSCULAR | Status: AC
  Administered 2024-08-27: 15 mg via INTRAMUSCULAR
  Filled 2024-08-27: qty 1

## 2024-08-27 MED ORDER — CYCLOBENZAPRINE HCL 5 MG PO TABS: 5.0000 mg | ORAL_TABLET | Freq: Two times a day (BID) | ORAL | 0 refills | Status: AC | PRN

## 2024-08-27 NOTE — Discharge Instructions (Addendum)
 Your x-ray did not show any broken bones.  You may take Tylenol /ibuprofen  to help with your symptoms per package instructions.  You may also take the cyclobenzaprine , the remember that this will make you very sleepy and you cannot drive, operate heavy machinery, or perform any test that require concentration and take this medication.  Please return for any new, worsening, or changing symptoms or other concerns.  It was a pleasure caring for you today.

## 2024-08-27 NOTE — ED Triage Notes (Addendum)
 Patient ambulatory to triage with steady gait, without difficulty or distress noted; pt reports she fell trying to sit down on a chair at work (declines desire to file workers comp at this); c/o lower back since; st hx of back issues; no meds taken PTA

## 2024-08-27 NOTE — ED Provider Notes (Signed)
 [facility] Provider Note    Event Date/Time   First MD Initiated Contact with Patient 08/27/24 [phone]     (approximate)   History   Back Pain   HPI  Stacy Downs is a 43 y.o. female who presents today for evaluation of back pain.  Patient reports that she went to sit on the chair yesterday and missed and landed on her buttocks and has had pain in her low back ever since.  She denies radiation of pain into her legs or into her abdomen.  She denies any weakness in her legs.  No urinary or fecal incontinence or retention.  Reports that she has back problems at baseline and so this makes it feel worse.  No urinary symptoms.  She has not taken anything for her pain.  She is requesting a Toradol  shot.  Denies tailbone pain.  Patient Active Problem List   Diagnosis Date Noted   Chronic venous insufficiency 06/13/2022   Lymphedema 06/13/2022          Physical Exam   Triage Vital Signs: ED Triage Vitals  Encounter Vitals Group     BP 08/27/24 0645 121/79     Girls Systolic BP Percentile --      Girls Diastolic BP Percentile --      Boys Systolic BP Percentile --      Boys Diastolic BP Percentile --      Pulse Rate 08/27/24 0645 65     Resp --      Temp 08/27/24 0645 97.9 F (36.6 C)     Temp Source 08/27/24 0645 Oral     SpO2 08/27/24 0645 100 %     Weight 08/27/24 0644 180 lb (81.6 kg)     Height 08/27/24 0644 5' 6 (1.676 m)     Head Circumference --      Peak Flow --      Pain Score 08/27/24 0650 8     Pain Loc --      Pain Education --      Exclude from Growth Chart --     Most recent vital signs: Vitals:   08/27/24 0645  BP: 121/79  Pulse: 65  Temp: 97.9 F (36.6 C)  SpO2: 100%    Physical Exam Vitals and nursing note reviewed.  Constitutional:      General: Awake and alert. No acute distress.    Appearance: Normal appearance. The patient is normal weight.  HENT:     Head: Normocephalic and atraumatic.     Mouth: Mucous  membranes are moist.  Eyes:     General: PERRL. Normal EOMs        Right eye: No discharge.        Left eye: No discharge.     Conjunctiva/sclera: Conjunctivae normal.  Cardiovascular:     Rate and Rhythm: Normal rate and regular rhythm.     Pulses: Normal pulses.  Pulmonary:     Effort: Pulmonary effort is normal. No respiratory distress.     Breath sounds: Normal breath sounds.  Abdominal:     Abdomen is soft. There is no abdominal tenderness. No rebound or guarding. No distention. Musculoskeletal:        General: No swelling. Normal range of motion.     Cervical back: Normal range of motion and neck supple.  Back: No midline tenderness.  Tenderness across the lumbar area.  Strength and sensation 5/5 to bilateral lower extremities. Normal great toe extension against resistance. Normal  sensation throughout feet. Normal patellar reflexes. Negative SLR and opposite SLR bilaterally. Negative FABER test Skin:    General: Skin is warm and dry.     Capillary Refill: Capillary refill takes less than 2 seconds.     Findings: No rash.  Neurological:     Mental Status: The patient is awake and alert.      ED Results / Procedures / Treatments   Labs (all labs ordered are listed, but only abnormal results are displayed) Labs Reviewed  POC URINE PREG, ED     EKG     RADIOLOGY I independently reviewed and interpreted imaging and agree with radiologists findings.     PROCEDURES:  Critical Care performed:   Procedures   MEDICATIONS ORDERED IN ED: Medications  ketorolac  (TORADOL ) 15 MG/ML injection 15 mg (15 mg Intramuscular Given 08/27/24 0833)     IMPRESSION / MDM / ASSESSMENT AND PLAN / ED COURSE  I reviewed the triage vital signs and the nursing notes.   Differential diagnosis includes, but is not limited to, contusion, compression fracture, acute on chronic back pain.  I reviewed the patient's chart.  Patient was seen in the emergency department in August also  for low back pain.  She was treated with a Toradol  shot.  Patient is awake and alert, hemodynamically stable and afebrile.  She is ambulatory with a steady gait.  She has no radicular symptoms.  No weakness or paresthesias in her lower extremities.  No saddle anesthesia or urinary/fecal incontinence or retention to suggest cord compression.  She has tenderness across her low back.  She was treated with Toradol  with good effect per her request.  At the time of discharge, she requested cyclobenzaprine  as she reports that this worked for her before.  She was reminded that she cannot drive, operate heavy machinery, or perform any test that require concentration while taking this medication.  We also discussed return precautions.  Patient understands and agrees with plan.  Discharged in stable condition.  Patient's presentation is most consistent with acute complicated illness / injury requiring diagnostic workup.    FINAL CLINICAL IMPRESSION(S) / ED DIAGNOSES   Final diagnoses:  Acute bilateral low back pain without sciatica     Rx / DC Orders   ED Discharge Orders          Ordered    cyclobenzaprine  (FLEXERIL ) 5 MG tablet  2 times daily PRN        08/27/24 9166             Note:  This document was prepared using Dragon voice recognition software and may include unintentional dictation errors.   Jordin Dambrosio E, PA-C 08/27/24 1255    Viviann Pastor, MD 08/28/24 1321
